# Patient Record
Sex: Male | Born: 1975 | Hispanic: Yes | Marital: Married | Smoking: Never smoker
Health system: Southern US, Community
[De-identification: ages and names within clinical notes are randomized; demographics above are authoritative.]

## PROBLEM LIST (undated history)

## (undated) DIAGNOSIS — E785 Hyperlipidemia, unspecified: Secondary | ICD-10-CM

## (undated) HISTORY — DX: Hyperlipidemia, unspecified: E78.5

---

## 2007-01-17 ENCOUNTER — Emergency Department: Payer: Self-pay | Admitting: Emergency Medicine

## 2010-07-28 HISTORY — PX: OTHER SURGICAL HISTORY: SHX169

## 2010-12-05 ENCOUNTER — Emergency Department: Payer: Self-pay | Admitting: Emergency Medicine

## 2010-12-06 HISTORY — PX: OTHER SURGICAL HISTORY: SHX169

## 2010-12-10 HISTORY — PX: OTHER SURGICAL HISTORY: SHX169

## 2010-12-27 ENCOUNTER — Ambulatory Visit: Payer: Self-pay | Admitting: Family Medicine

## 2011-01-30 ENCOUNTER — Encounter: Payer: Self-pay | Admitting: Internal Medicine

## 2011-01-31 ENCOUNTER — Ambulatory Visit (INDEPENDENT_AMBULATORY_CARE_PROVIDER_SITE_OTHER): Payer: BC Managed Care – PPO | Admitting: Internal Medicine

## 2011-01-31 ENCOUNTER — Encounter: Payer: Self-pay | Admitting: Internal Medicine

## 2011-01-31 ENCOUNTER — Other Ambulatory Visit (INDEPENDENT_AMBULATORY_CARE_PROVIDER_SITE_OTHER): Payer: BC Managed Care – PPO

## 2011-01-31 VITALS — HR 56 | Temp 98.3°F | Ht 66.0 in | Wt 174.0 lb

## 2011-01-31 DIAGNOSIS — R079 Chest pain, unspecified: Secondary | ICD-10-CM

## 2011-01-31 DIAGNOSIS — R918 Other nonspecific abnormal finding of lung field: Secondary | ICD-10-CM

## 2011-01-31 LAB — CBC WITH DIFFERENTIAL/PLATELET
Eosinophils Absolute: 0.6 10*3/uL (ref 0.0–0.7)
Lymphocytes Relative: 43.5 % (ref 12.0–46.0)
MCHC: 35 g/dL (ref 30.0–36.0)
MCV: 88 fl (ref 78.0–100.0)
Monocytes Absolute: 0.4 10*3/uL (ref 0.1–1.0)
Neutrophils Relative %: 42.4 % — ABNORMAL LOW (ref 43.0–77.0)
Platelets: 223 10*3/uL (ref 150.0–400.0)
WBC: 7.3 10*3/uL (ref 4.5–10.5)

## 2011-01-31 LAB — HEPATIC FUNCTION PANEL
AST: 24 U/L (ref 0–37)
Total Bilirubin: 0.6 mg/dL (ref 0.3–1.2)

## 2011-01-31 LAB — BASIC METABOLIC PANEL
BUN: 15 mg/dL (ref 6–23)
Creatinine, Ser: 0.9 mg/dL (ref 0.4–1.5)
GFR: 107.65 mL/min (ref >60.00)
Potassium: 3.6 mEq/L (ref 3.5–5.1)

## 2011-01-31 NOTE — Progress Notes (Signed)
Subjective:     Patient ID: Evan Anderson, male   DOB: 1976/01/26, 35 y.o.   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  01/31/2011 Initial pulmonary office cc  shooting pains and tightness x 2 years much worse x 2 month always located in the exact same area = just  anteriorly around R breast on the right definitely worse with deep breath,  Absent lying down.  Started flovent helped a little bit,  Was on fish oil   at the time the pain got bad.  Had bad tooth but this was removed in Nov 2011. No other exposures noted. Baseline cxr is normal, the only finding was abn RN changes sym bilaterally by CT December 27 2010 in [place] so referred to pulmonary clinic for this eval.  Pt denies any significant sore throat, dysphagia, itching, sneezing,  nasal congestion or excess/ purulent secretions,  fever, chills, sweats, unintended wt loss, classically lateralizing  pleuritic or exertional cp, hempoptysis, orthopnea pnd or leg swelling.    Also denies any obvious fluctuation of symptoms with weather or environmental changes or other aggravating or alleviating factors.        Review of Systems  Constitutional: Negative for fever, chills, activity change, appetite change and unexpected weight change.  HENT: Negative for congestion, sore throat, rhinorrhea, sneezing, trouble swallowing, dental problem, voice change and postnasal drip.   Eyes: Negative for visual disturbance.  Respiratory: Negative for cough, choking and shortness of breath.   Cardiovascular: Positive for chest pain. Negative for leg swelling.  Gastrointestinal: Negative for nausea, vomiting and abdominal pain.  Genitourinary: Negative for difficulty urinating.  Musculoskeletal: Negative for arthralgias.  Skin:  Negative for rash.  Psychiatric/Behavioral: Negative for behavioral problems and confusion. The patient is nervous/anxious.        Objective:   Physical Exam Pleasant healthy appearing latino male NAD Wt 174 01/31/11 HEENT: nl dentition, turbinates, and orophanx. Nl external ear canals without cough reflex   NECK :  without JVD/Nodes/TM/ nl carotid upstrokes bilaterally   LUNGS: no acc muscle use, clear to A and P bilaterally without cough on insp or exp maneuvers   CV:  RRR  no s3 or murmur or increase in P2, no edema   ABD:  soft and nontender with nl excursion in the supine position. No bruits or organomegaly, bowel sounds nl  MS:  warm without deformities, calf tenderness, cyanosis or clubbing  SKIN: warm and dry without lesions    NEURO:  alert, approp, no deficits      Assessment:         Plan:

## 2011-01-31 NOTE — Patient Instructions (Addendum)
Return as soon as possible for PFT's   Citrucel 1 tsp twice daily with large glass of water and avoid all foods that cause gas (beans, uncooked vegetables and salads, boiled eggs)  Stop flovent and fish oil for now

## 2011-02-01 DIAGNOSIS — R918 Other nonspecific abnormal finding of lung field: Secondary | ICD-10-CM | POA: Insufficient documentation

## 2011-02-01 DIAGNOSIS — R079 Chest pain, unspecified: Secondary | ICD-10-CM | POA: Insufficient documentation

## 2011-02-01 NOTE — Assessment & Plan Note (Signed)
DDx for pulmonary fibrosis  includes idiopathic pulmonary fibrosis, pulmonary fibrosis associated with rheumatologic diseases (which have a relatively benign course in most cases) , adverse effect from  drugs such as chemotherapy or amiodarone exposure, nonspecific interstitial pneumonia which is typically steroid responsive, and chronic hypersensitivity pneumonitis.   In active  smokers Langerhan's Cell  Histiocyctosis (eosinophilic granuomatosis),  DIP,  and Respiratory Bronchiolitis ILD also need to be considered,    Not clear this anything more than previous lung injury or exposure as baseline cxr is nl as of May 2012.  Will repeat cxr and PFT's to see if any macroscopic changes evident but very strongly doubt this has anything to do with his symptoms and not severe or active enough to warrant invasive w/u with  ESR.= 6    See instructions for specific recommendations which were reviewed directly with the patient who was given a copy with highlighter outlining the key components.

## 2011-02-01 NOTE — Assessment & Plan Note (Signed)
Classic but atypical pain pattern suggests ibs:  Stereotypical,  with a very limited distribution of pain locations, daytime, not exacerbated by ex or coughing, worse in sitting position,   not present supine due to the dome effect of the diaphragm is  canceled in that position. Frequently these patients have had multiple negative GI workups and CT scans.  Treatment consists of avoiding foods that cause gas (especially beans and raw vegetables like spinach and salads)  and citrucel 1 heaping tsp twice daily with a large glass of water.  Pain should improve w/in 2 weeks and if not then consider further GI work up.

## 2011-02-04 ENCOUNTER — Telehealth: Payer: Self-pay | Admitting: Internal Medicine

## 2011-02-04 NOTE — Progress Notes (Signed)
Quick Note:  Spoke with pt and notified of results per Dr. Wert. Pt verbalized understanding and denied any questions.  ______ 

## 2011-02-04 NOTE — Telephone Encounter (Signed)
Spoke with pt and notified of results per Dr. Wert. Pt verbalized understanding and denied any questions. 

## 2011-02-12 ENCOUNTER — Ambulatory Visit (INDEPENDENT_AMBULATORY_CARE_PROVIDER_SITE_OTHER): Payer: BC Managed Care – PPO | Admitting: Internal Medicine

## 2011-02-12 ENCOUNTER — Telehealth: Payer: Self-pay | Admitting: *Deleted

## 2011-02-12 ENCOUNTER — Encounter: Payer: Self-pay | Admitting: Internal Medicine

## 2011-02-12 VITALS — BP 112/82 | HR 56 | Temp 97.9°F | Ht 66.0 in | Wt 172.0 lb

## 2011-02-12 DIAGNOSIS — R918 Other nonspecific abnormal finding of lung field: Secondary | ICD-10-CM

## 2011-02-12 DIAGNOSIS — R079 Chest pain, unspecified: Secondary | ICD-10-CM

## 2011-02-12 LAB — PULMONARY FUNCTION TEST
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
## 2011-02-12 NOTE — Patient Instructions (Addendum)
Continue advil 3 with meals as needed   Continue to avoid foods you know cause gas  Please schedule a follow up visit in 2 months with CXR on return unless condition worsens >   call sooner
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
## 2011-02-12 NOTE — Assessment & Plan Note (Addendum)
Followed in Pulmonary clinic/ [facility]/ Maricia Scotti    - See CT Chest [place] 12/27/10, sym diffuse    -  Minimal Eosinophilia, nl ESR    01/31/11    -  PFTs wnl 02/12/2011     -  F/u cxr in tickle file for 03/2011   Muliple pulmonary nodules < 8 mm are too small to be detected by PET scanning,  minimally invasive bx or f/u on cxr for that matter, and old xrays won't do any good here.   Most likely they are benign; however, given the mulitple sites noted, there is no early option for surgical cure at this point unless one grows relative to the others (suggesting two separate processes)   Discussed in detail all the  indications, usual  risks and alternatives  relative to the benefits with patient who agrees to proceed with f/u cxr in 2 months (placed in tickle file)

## 2011-02-12 NOTE — Progress Notes (Signed)
PFT done today. 

## 2011-02-12 NOTE — Telephone Encounter (Signed)
error 

## 2011-03-07 ENCOUNTER — Telehealth: Payer: Self-pay | Admitting: *Deleted

## 2011-03-07 NOTE — Telephone Encounter (Signed)
Per MW- pt due for f/u cxr by 04/15/11. LMTCB so we can set this up now.

## 2011-03-07 NOTE — Telephone Encounter (Signed)
Message copied by Christen Butter on Fri Mar 07, 2011  5:00 PM ------      Message from: Christen Butter      Created: Wed Feb 12, 2011  4:40 PM       Pt needs cxr by Sept 18th 2012

## 2011-03-11 NOTE — Telephone Encounter (Signed)
Spoke with pt's wife.  She states this has already been set up at last OV.  Per last OV on 02/12/11, pt was to f/u in 2 months with a cxr.  F/u was scheduled for 04/15/11 at 8:45am -- wife aware and will inform pt and will have him arrive early for cxr first.  Will forward message to MW so he is aware -- if this date is ok with you, pls sign off on note.  Thanks!

## 2011-03-11 NOTE — Telephone Encounter (Signed)
PT'S SPOUSE MICHELE RETURNED CALL. [phone]

## 2011-04-14 ENCOUNTER — Other Ambulatory Visit: Payer: Self-pay | Admitting: Internal Medicine

## 2011-04-14 DIAGNOSIS — R918 Other nonspecific abnormal finding of lung field: Secondary | ICD-10-CM

## 2011-04-15 ENCOUNTER — Encounter: Payer: BC Managed Care – PPO | Admitting: Internal Medicine

## 2011-04-15 NOTE — Progress Notes (Signed)
Subjective:     Patient ID: Evan Anderson, male   DOB: Aug 17, 1975, 35 y.o.   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  01/31/2011 Initial pulmonary office cc  shooting pains and tightness x 2 years much worse x 2 month always located in the exact same area = just  anteriorly around R breast on the right definitely worse with deep breath,  Absent lying down.  Started flovent helped a little bit,  Was on fish oil   at the time the pain got bad.  Had bad tooth but this was removed in Nov 2011. No other exposures noted but does remember serious injury to ant chest wall with similar pain about a year before onset of pain but this was more anterior than R side, did hurt worse with insp and exercise as is the case with this pain  Baseline cxr is normal, the only finding was abn RN changes sym bilaterally by CT December 27 2010 in [place] so referred to pulmonary clinic for this eval.  rec Citrucel 1 tsp twice daily with large glass of water and avoid all foods that cause gas (beans, uncooked vegetables and salads, boiled eggs)  Stop flovent and fish oil for now   02/12/2011 ov/Dehaven Sine cc breathing better but pain is predictable, worse when work out, better when lie down, better with advil but only ever takes 2 at most. 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    Also denies any obvious fluctuation of symptoms with weather or environmental changes or other  aggravating or alleviating factors.                    Objective:   Physical Exam Pleasant healthy appearing latino male NAD Wt 174 01/31/11 > wt 172  02/12/2011  > 04/15/2011  HEENT: nl dentition, turbinates, and orophanx. Nl external ear canals without cough reflex   NECK :  without JVD/Nodes/TM/ nl carotid upstrokes bilaterally   LUNGS: no acc muscle use, clear to A and P bilaterally without cough on insp or exp maneuvers   CV:  RRR  no s3 or murmur or increase in P2, no edema   ABD:  soft and nontender with nl excursion in the supine position. No bruits or organomegaly, bowel sounds nl  MS:  warm without deformities, calf tenderness, cyanosis or clubbing  SKIN: warm and dry without lesions    c    Assessment:         Plan:

## 2012-07-14 IMAGING — CT CT HEAD WITHOUT CONTRAST
2 series · 16 of 30 positions shown, 20 images · non-contrast
Comparison: none

REASON FOR EXAM: headache
COMMENTS:

[Series 2: without · axial · non-contrast · 0.43mm/px · z∈[+1090,+1214]mm · 13 of 31 slices shown, 17 images]
[im 3/31  brain]
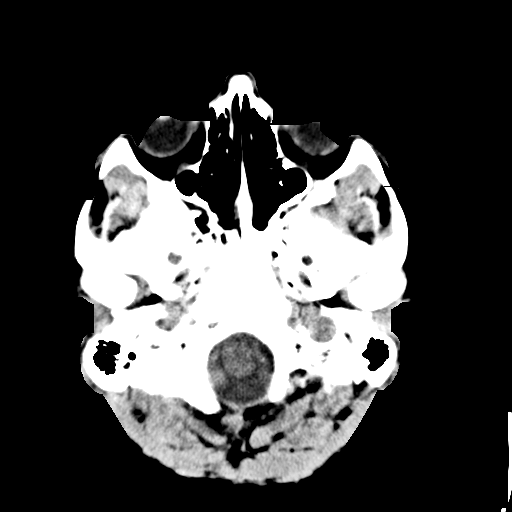
[im 3/31  bone]
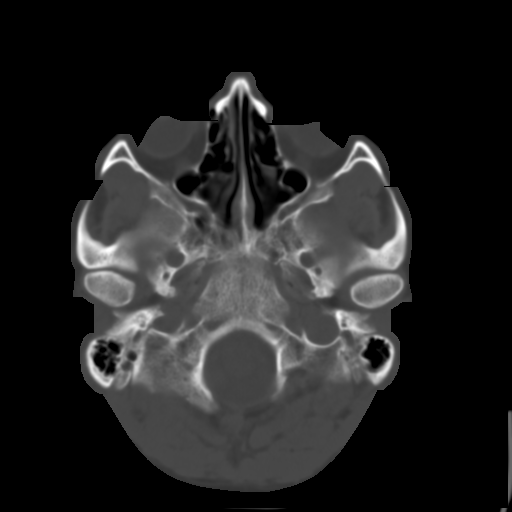
[im 5/31  brain]
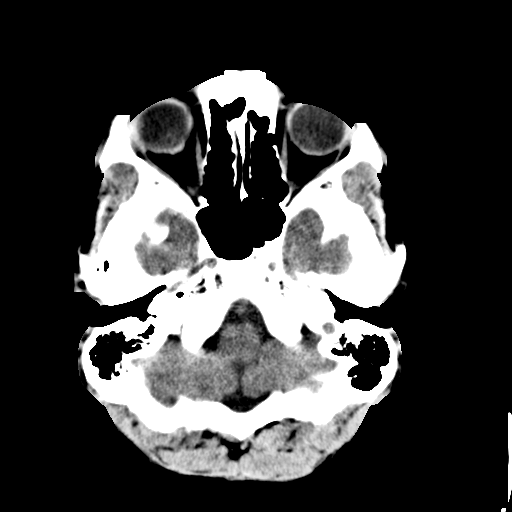
[im 7/31  brain]
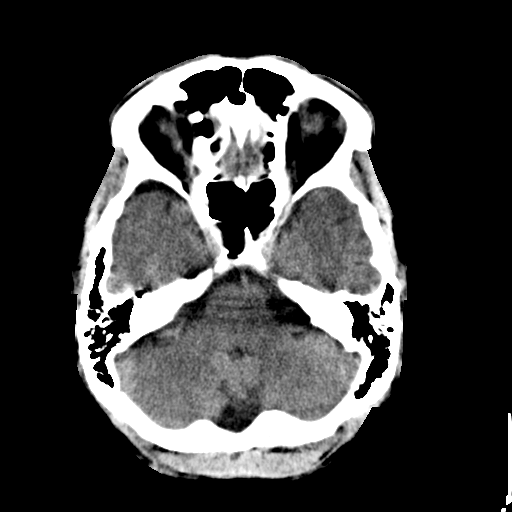
[im 9/31  brain]
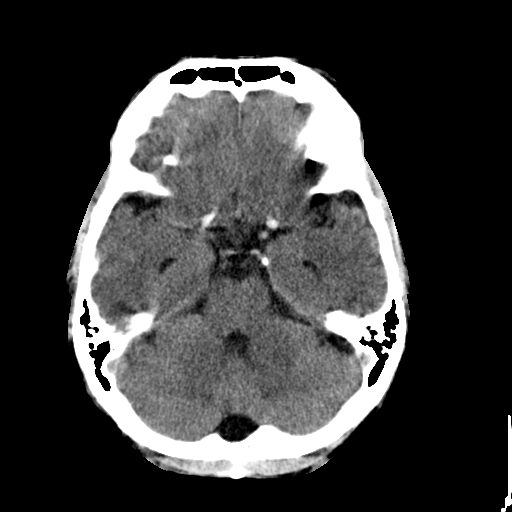
[im 11/31  brain]
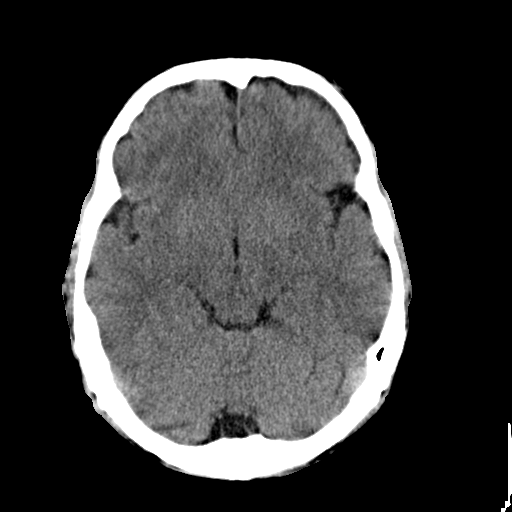
[im 11/31  bone]
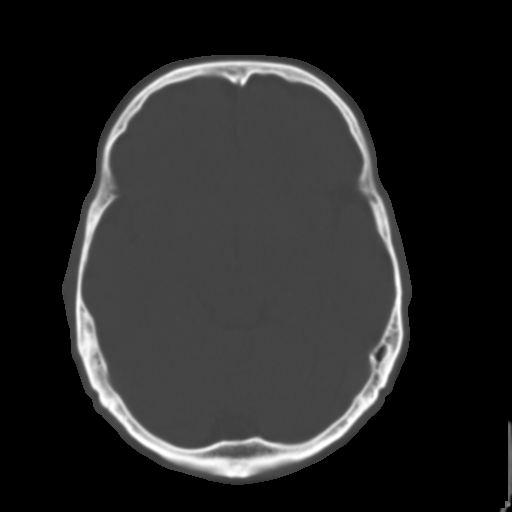
[im 13/31  brain]
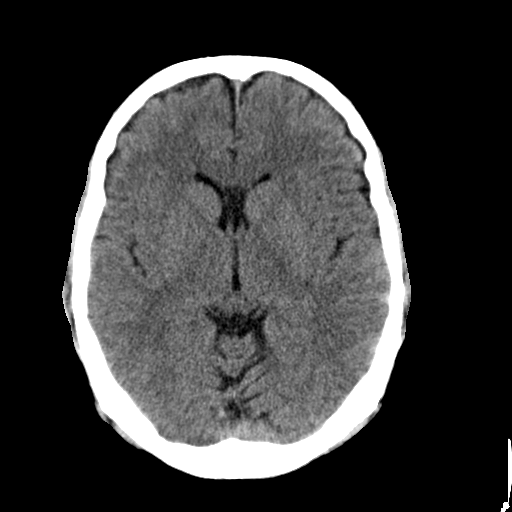
[im 16/31  brain]
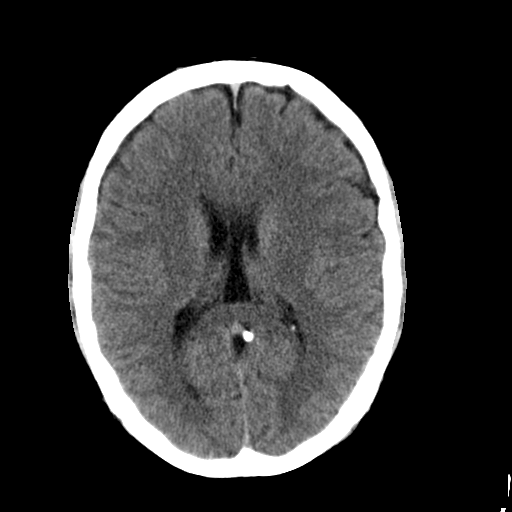
[im 18/31  brain]
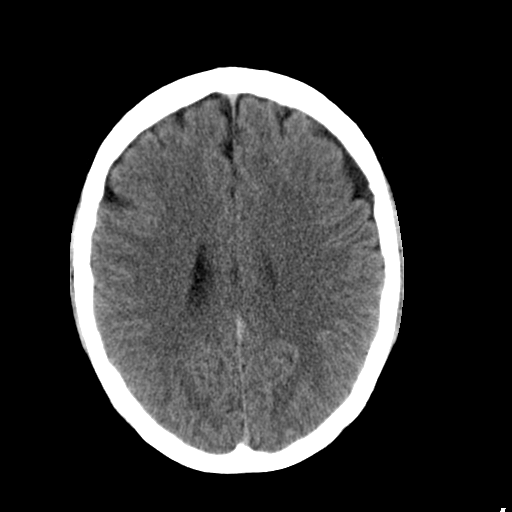
[im 20/31  brain]
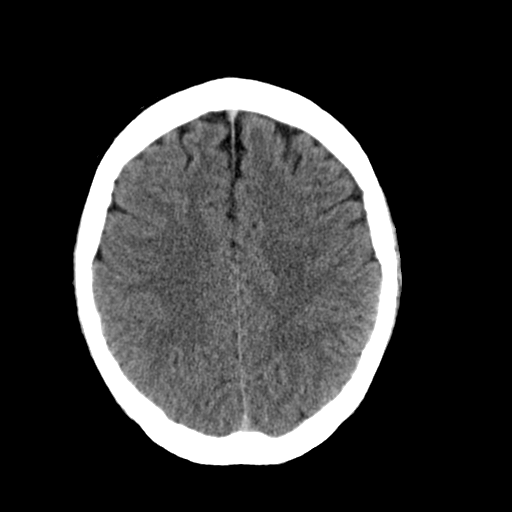
[im 20/31  bone]
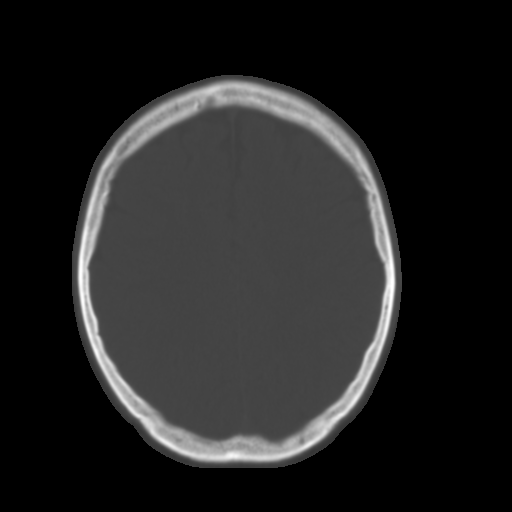
[im 22/31  brain]
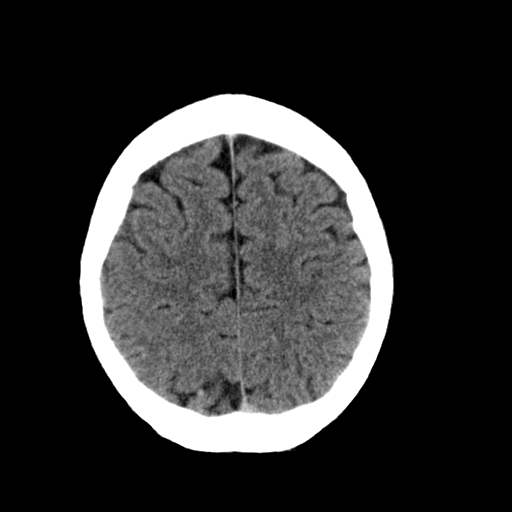
[im 24/31  brain]
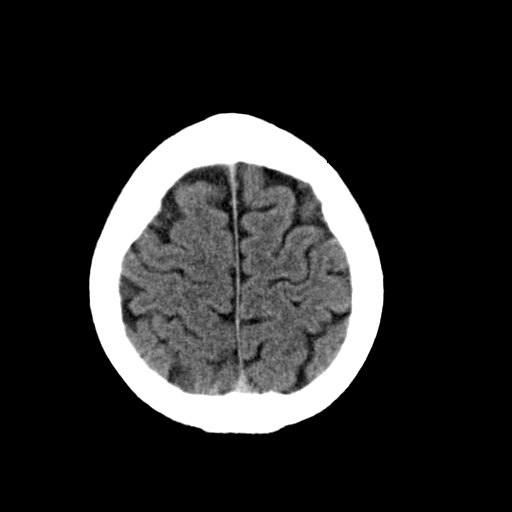
[im 26/31  brain]
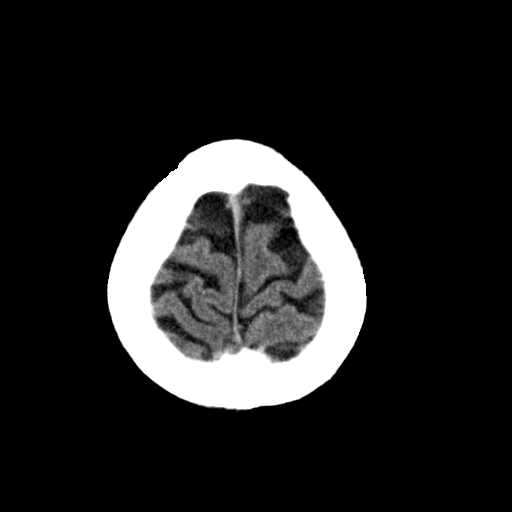
[im 28/31  brain]
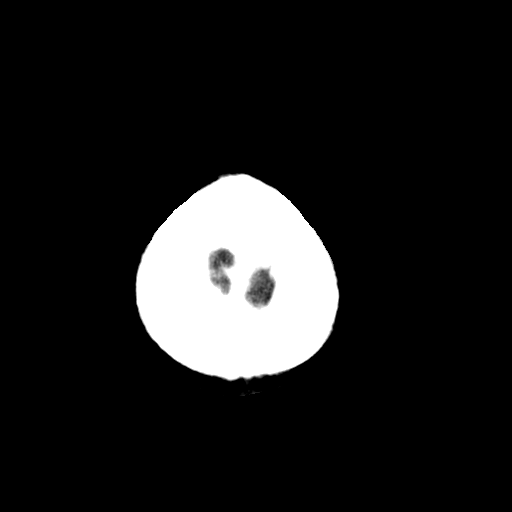
[im 28/31  bone]
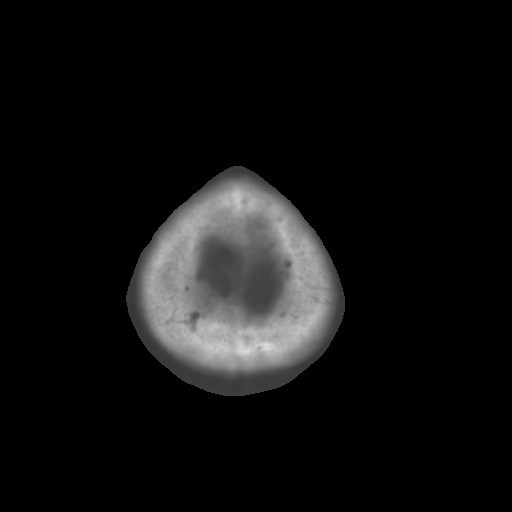

[Series 3: bone · axial · 0.43mm/px · z∈[+1090,+1130]mm · 3 of 31 slices shown]
[im 3/31  bone]
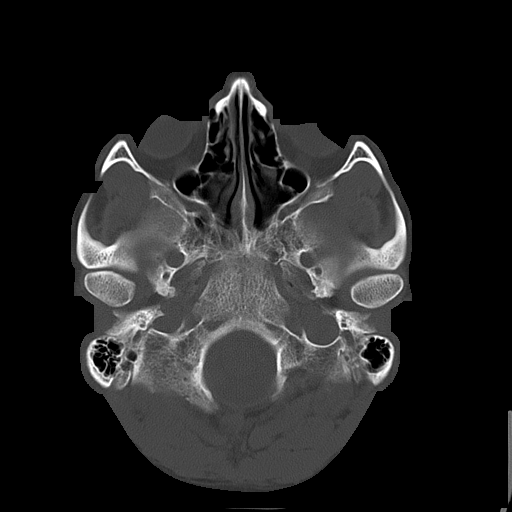
[im 7/31  bone]
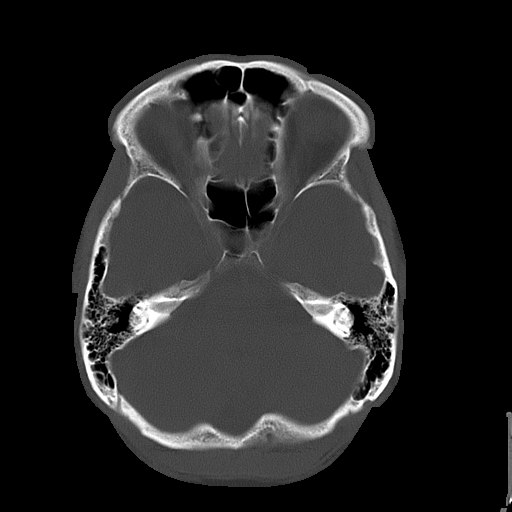
[im 11/31  bone]
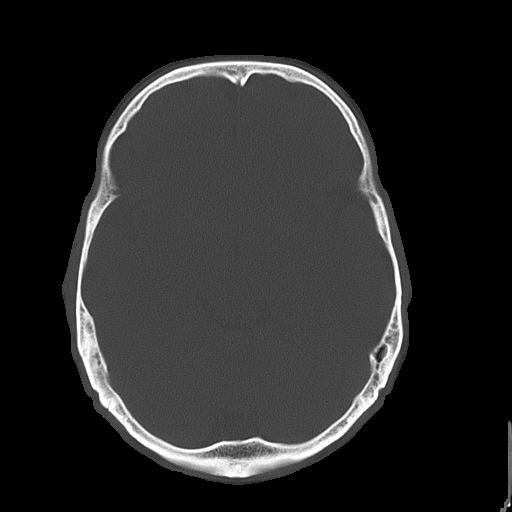

[16 of 30 positions shown; findings below may reference images not displayed]

PROCEDURE:     CT  - CT HEAD WITHOUT CONTRAST  - December 06, 2010  [DATE]

RESULT:     Axial noncontrast CT scanning was performed through the brain at
5 mm intervals and slice thicknesses.

The ventricles are normal in size and position. There is a cavum septum
pellucidum et vergae which is a normal variant. There is no intracranial
hemorrhage nor intracranial mass effect. There is no evidence of an evolving
ischemic infarction. The cerebellum and brainstem exhibit no acute
abnormality. At bone window settings the observed portions of the paranasal
sinuses and mastoid air cells are clear.
IMPRESSION: I see no acute intracranial abnormality.

A preliminary report was sent to the [HOSPITAL] the conclusion
of the study.

## 2012-10-19 ENCOUNTER — Ambulatory Visit: Payer: Self-pay | Admitting: Family Medicine

## 2013-03-04 ENCOUNTER — Ambulatory Visit: Payer: Self-pay | Admitting: Family Medicine

## 2013-04-13 NOTE — Progress Notes (Signed)
This encounter was created in error - please disregard.

## 2014-10-31 LAB — LIPID PANEL
Cholesterol: 224 mg/dL — AB (ref 0–200)
HDL: 42 mg/dL (ref 35–70)
LDL Cholesterol: 140 mg/dL
Triglycerides: 210 mg/dL — AB (ref 40–160)

## 2014-10-31 LAB — HEMOGLOBIN A1C: HEMOGLOBIN A1C: 5.9

## 2015-02-21 ENCOUNTER — Telehealth: Payer: Self-pay | Admitting: Family Medicine

## 2015-02-21 DIAGNOSIS — E785 Hyperlipidemia, unspecified: Secondary | ICD-10-CM

## 2015-02-21 DIAGNOSIS — R7303 Prediabetes: Secondary | ICD-10-CM | POA: Insufficient documentation

## 2015-02-21 NOTE — Telephone Encounter (Signed)
Pt's wife request a lab slip for A1C & cholesterol. Thanks TNP

## 2015-02-21 NOTE — Telephone Encounter (Signed)
Order printed needs to be fasting

## 2015-02-22 DIAGNOSIS — Z0000 Encounter for general adult medical examination without abnormal findings: Secondary | ICD-10-CM

## 2015-02-23 ENCOUNTER — Telehealth: Payer: Self-pay

## 2015-02-23 LAB — HEMOGLOBIN A1C
Est. average glucose Bld gHb Est-mCnc: 123 mg/dL
HEMOGLOBIN A1C: 5.9 % — AB (ref 4.8–5.6)

## 2015-02-23 LAB — LIPID PANEL
CHOL/HDL RATIO: 5.1 ratio — AB (ref 0.0–5.0)
CHOLESTEROL TOTAL: 240 mg/dL — AB (ref 100–199)
HDL: 47 mg/dL (ref >39)
LDL CALC: 164 mg/dL — AB (ref 0–99)
TRIGLYCERIDES: 147 mg/dL (ref 0–149)
VLDL Cholesterol Cal: 29 mg/dL (ref 5–40)

## 2015-02-23 NOTE — Telephone Encounter (Signed)
Patient wife Marcelino Duster called wanting lab results. Patient had blood work done yesterday. I see that the results are in but have not been reviewed and signed off on yet. Call patient wife at [phone] with results.

## 2015-02-26 ENCOUNTER — Telehealth: Payer: Self-pay | Admitting: Family Medicine

## 2015-02-26 ENCOUNTER — Other Ambulatory Visit: Payer: Self-pay | Admitting: *Deleted

## 2015-02-26 NOTE — Telephone Encounter (Signed)
Patient notified of results. Patient has been taking pravastatin 20 mg daily. Patient stated that he just refilled his rx and has 30 pills left.

## 2015-02-26 NOTE — Telephone Encounter (Signed)
-----   Message from Malva Limes, MD sent at 02/26/2015  7:57 AM EDT ----- Cholesterol is up quite a bit from 224 to 240. HgA1c is stable at 5.9, which just slightly higher then normal. If he is taking his cholesterol medications he will need to change to something stronger, if not then he needs to start back on it.

## 2015-02-26 NOTE — Telephone Encounter (Signed)
Patient notified of results. Expressed understanding.

## 2015-02-26 NOTE — Telephone Encounter (Signed)
Results from a1c and chol.  [phone].  Please call for results.  Thanks, Barth Kirks

## 2015-02-27 NOTE — Telephone Encounter (Signed)
Advised patient as below.  

## 2015-02-27 NOTE — Telephone Encounter (Signed)
Need to increase pravastatin to  a day, #30, rf x 6. May take 2 x  a day until current bottle is out. Need to recheck lipids in 6 weeks.

## 2015-03-30 ENCOUNTER — Telehealth: Payer: Self-pay | Admitting: Family Medicine

## 2015-03-30 DIAGNOSIS — E785 Hyperlipidemia, unspecified: Secondary | ICD-10-CM

## 2015-03-30 NOTE — Telephone Encounter (Signed)
Pt wife, Marcelino Duster request a lab slip to recheck cholesterol and A1C.  CB#[phone]/MW

## 2015-04-04 NOTE — Telephone Encounter (Signed)
Did patient only need the lipid panel rechecked and not the A1c? See phone note from 02/26/2015 in chart review.

## 2015-04-04 NOTE — Telephone Encounter (Signed)
Pt wife is calling to ask when this will be ready.  CB#[phone]/MW

## 2015-04-04 NOTE — Telephone Encounter (Signed)
Lab ready to pick-up.

## 2015-04-04 NOTE — Telephone Encounter (Signed)
Just needs lipids. A1c should be checked no more than twice a year.

## 2015-04-05 ENCOUNTER — Other Ambulatory Visit: Payer: Self-pay | Admitting: Family Medicine

## 2015-04-05 DIAGNOSIS — Z0000 Encounter for general adult medical examination without abnormal findings: Secondary | ICD-10-CM

## 2015-04-06 LAB — LIPID PANEL
CHOL/HDL RATIO: 4.6 ratio (ref 0.0–5.0)
CHOLESTEROL TOTAL: 221 mg/dL — AB (ref 100–199)
HDL: 48 mg/dL (ref >39)
LDL CALC: 139 mg/dL — AB (ref 0–99)
TRIGLYCERIDES: 171 mg/dL — AB (ref 0–149)
VLDL CHOLESTEROL CAL: 34 mg/dL (ref 5–40)

## 2015-06-05 ENCOUNTER — Other Ambulatory Visit: Payer: Self-pay | Admitting: Family Medicine

## 2015-06-05 NOTE — Telephone Encounter (Signed)
Please call in alprazolam.  

## 2015-06-05 NOTE — Telephone Encounter (Signed)
Rx called in to pharmacy. 

## 2015-06-19 ENCOUNTER — Other Ambulatory Visit: Payer: Self-pay | Admitting: Family Medicine

## 2015-06-19 ENCOUNTER — Telehealth: Payer: Self-pay | Admitting: Family Medicine

## 2015-06-19 NOTE — Telephone Encounter (Signed)
Pt's wife calling stating pt needs refill on his pravastatin (PRAVACHOL) 20 MG tablet   wife states this needs to be changed to a 30 day supply its only a 15 day supply, pt's wife states they are going out of town would like for this to be done ASAP if possible. I explain to the wife Dr's have a turn around of 24 to 48 hours on refills on prescriptions. @ [facility] [address] [place].  Thanks CC

## 2015-06-19 NOTE — Telephone Encounter (Signed)
Please review-aa 

## 2015-06-20 ENCOUNTER — Other Ambulatory Visit: Payer: Self-pay | Admitting: Family Medicine

## 2015-06-20 MED ORDER — PRAVASTATIN SODIUM 40 MG PO TABS: 40.0000 mg | ORAL_TABLET | Freq: Every day | ORAL | Status: DC

## 2015-09-10 ENCOUNTER — Ambulatory Visit (INDEPENDENT_AMBULATORY_CARE_PROVIDER_SITE_OTHER): Payer: Self-pay | Admitting: Family Medicine

## 2015-09-10 ENCOUNTER — Encounter: Payer: Self-pay | Admitting: Family Medicine

## 2015-09-10 VITALS — BP 104/74 | HR 52 | Temp 98.1°F | Resp 16 | Wt 176.6 lb

## 2015-09-10 DIAGNOSIS — M2550 Pain in unspecified joint: Secondary | ICD-10-CM | POA: Insufficient documentation

## 2015-09-10 DIAGNOSIS — G4700 Insomnia, unspecified: Secondary | ICD-10-CM | POA: Insufficient documentation

## 2015-09-10 DIAGNOSIS — R7303 Prediabetes: Secondary | ICD-10-CM

## 2015-09-10 DIAGNOSIS — F410 Panic disorder [episodic paroxysmal anxiety] without agoraphobia: Secondary | ICD-10-CM | POA: Insufficient documentation

## 2015-09-10 DIAGNOSIS — Z8709 Personal history of other diseases of the respiratory system: Secondary | ICD-10-CM | POA: Insufficient documentation

## 2015-09-10 DIAGNOSIS — F419 Anxiety disorder, unspecified: Secondary | ICD-10-CM | POA: Insufficient documentation

## 2015-09-10 DIAGNOSIS — R9389 Abnormal findings on diagnostic imaging of other specified body structures: Secondary | ICD-10-CM | POA: Insufficient documentation

## 2015-09-10 DIAGNOSIS — M791 Myalgia, unspecified site: Secondary | ICD-10-CM | POA: Insufficient documentation

## 2015-09-10 DIAGNOSIS — E785 Hyperlipidemia, unspecified: Secondary | ICD-10-CM

## 2015-09-10 NOTE — Progress Notes (Signed)
Patient: Evan Anderson Male    DOB: 07-15-1976   40 y.o.   MRN: [id number] Visit Date: 09/10/2015  Today's Provider: Mila Merry, MD   Chief Complaint  Patient presents with  . Follow-up  . Hyperlipidemia  . Insomnia   Subjective:    HPI    Follow-up for insomnia from 10/30/2014; no changes. Continue Xanax 0.25 mg prn.   Follow-up for myalgia from 10/30/2014; no changes. Continue methocarbamol 750 mg.   Follow-up for arthralgia from 10/30/2014; started Nabumetone 500 mg prn for joint pain.    Lipid/Cholesterol, Follow-up:   Last seen for this10 months ago.  Management changes since that visit include; no changes. . Last Lipid Panel:    Component Value Date/Time   CHOL 221* 04/05/2015 0819   CHOL 224* 10/31/2014   TRIG 171* 04/05/2015 0819   HDL 48 04/05/2015 0819   HDL 42 10/31/2014   CHOLHDL 4.6 04/05/2015 0819   LDLCALC 139* 04/05/2015 0819   LDLCALC 140 10/31/2014    Risk factors for vascular disease include none  He reports good compliance with treatment. He is not having side effects.  Current symptoms include none and have been unchanged. Weight trend: stable Prior visit with dietician: no Current diet: in general, a "healthy" diet   Current exercise: running/ jogging/weight lifting  Wt Readings from Last 3 Encounters:  09/10/15 176 lb 9.6 oz (80.105 kg)  02/12/11 172 lb (78.019 kg)  01/31/11 174 lb (78.926 kg)    -------------------------------------------------------------------    No Known Allergies Previous Medications   ALPRAZOLAM (XANAX) 0.25 MG TABLET    TAKE 1 TO 2 TABLETS BY MOUTH EVERY DAY AS NEEDED   IBUPROFEN (ADVIL,MOTRIN) 200 MG TABLET    3 tablets 2 times daily    METHOCARBAMOL (ROBAXIN) 750 MG TABLET    Take 2 tablets by mouth every 6 (six) hours as needed.   PRAVASTATIN (PRAVACHOL) 40 MG TABLET    Take 1 tablet (40 mg total) by mouth daily.    Review of Systems  Constitutional: Negative for fever, chills and appetite  change.  Respiratory: Negative for chest tightness, shortness of breath and wheezing.   Cardiovascular: Negative for chest pain and palpitations.  Gastrointestinal: Negative for nausea, vomiting and abdominal pain.    Social History  Substance Use Topics  . Smoking status: Never Smoker   . Smokeless tobacco: Never Used  . Alcohol Use: 0.0 oz/week    0 Standard drinks or equivalent per week     Comment: occasional use   Objective:   BP 104/74 mmHg  Pulse 52  Temp(Src) 98.1 F (36.7 C) (Oral)  Resp 16  Wt 176 lb 9.6 oz (80.105 kg)  SpO2 99%  Physical Exam   General Appearance:    Alert, cooperative, no distress  Eyes:    PERRL, conjunctiva/corneas clear, EOM's intact       Lungs:     Clear to auscultation bilaterally, respirations unlabored  Heart:    Regular rate and rhythm  Neurologic:   Awake, alert, oriented x 3. No apparent focal neurological           defect.           Assessment & Plan:     1. Hyperlipidemia He is tolerating pravastatin well with no adverse effects.   - Hepatic function panel - Lipid panel  2. Prediabetes  - Hemoglobin A1c        Mila Merry, MD  [facility]

## 2015-09-11 LAB — HEPATIC FUNCTION PANEL
ALK PHOS: 100 IU/L (ref 39–117)
ALT: 37 IU/L (ref 0–44)
AST: 25 IU/L (ref 0–40)
Albumin: 4.4 g/dL (ref 3.5–5.5)
BILIRUBIN TOTAL: 0.8 mg/dL (ref 0.0–1.2)
BILIRUBIN, DIRECT: 0.17 mg/dL (ref 0.00–0.40)
Total Protein: 7.1 g/dL (ref 6.0–8.5)

## 2015-09-11 LAB — LIPID PANEL
CHOLESTEROL TOTAL: 160 mg/dL (ref 100–199)
Chol/HDL Ratio: 3.7 ratio units (ref 0.0–5.0)
HDL: 43 mg/dL (ref >39)
LDL Calculated: 90 mg/dL (ref 0–99)
Triglycerides: 135 mg/dL (ref 0–149)
VLDL CHOLESTEROL CAL: 27 mg/dL (ref 5–40)

## 2015-09-11 LAB — HEMOGLOBIN A1C
ESTIMATED AVERAGE GLUCOSE: 117 mg/dL
HEMOGLOBIN A1C: 5.7 % — AB (ref 4.8–5.6)

## 2015-09-12 ENCOUNTER — Ambulatory Visit: Payer: Self-pay | Admitting: Family Medicine

## 2015-10-14 ENCOUNTER — Other Ambulatory Visit: Payer: Self-pay | Admitting: Family Medicine

## 2015-12-29 ENCOUNTER — Other Ambulatory Visit: Payer: Self-pay | Admitting: Family Medicine

## 2015-12-30 NOTE — Telephone Encounter (Signed)
Please call in alprazolam.  

## 2015-12-31 MED ORDER — ALPRAZOLAM 0.25 MG PO TABS: ORAL_TABLET | ORAL | Status: DC

## 2015-12-31 NOTE — Telephone Encounter (Signed)
[facility], something is wrong with their phones. Will try again later.

## 2015-12-31 NOTE — Telephone Encounter (Signed)
Rx was faxed to pharmacy.  

## 2016-07-18 ENCOUNTER — Other Ambulatory Visit: Payer: Self-pay | Admitting: Family Medicine

## 2016-07-24 ENCOUNTER — Other Ambulatory Visit: Payer: Self-pay | Admitting: Family Medicine

## 2016-07-24 NOTE — Telephone Encounter (Signed)
Please call in alprazolam.  

## 2016-07-25 NOTE — Telephone Encounter (Signed)
Rx called in to pharmacy. 

## 2016-10-18 ENCOUNTER — Other Ambulatory Visit: Payer: Self-pay | Admitting: Family Medicine

## 2016-12-01 ENCOUNTER — Ambulatory Visit (INDEPENDENT_AMBULATORY_CARE_PROVIDER_SITE_OTHER): Payer: Self-pay | Admitting: Family Medicine

## 2016-12-01 ENCOUNTER — Encounter: Payer: Self-pay | Admitting: Family Medicine

## 2016-12-01 VITALS — BP 122/78 | HR 65 | Temp 97.8°F | Resp 16 | Wt 184.0 lb

## 2016-12-01 DIAGNOSIS — H6982 Other specified disorders of Eustachian tube, left ear: Secondary | ICD-10-CM

## 2016-12-01 DIAGNOSIS — R7303 Prediabetes: Secondary | ICD-10-CM

## 2016-12-01 DIAGNOSIS — H6692 Otitis media, unspecified, left ear: Secondary | ICD-10-CM

## 2016-12-01 DIAGNOSIS — E785 Hyperlipidemia, unspecified: Secondary | ICD-10-CM

## 2016-12-01 MED ORDER — AMOXICILLIN 500 MG PO CAPS: 1000.0000 mg | ORAL_CAPSULE | Freq: Two times a day (BID) | ORAL | 0 refills | Status: AC

## 2016-12-01 MED ORDER — FLUTICASONE PROPIONATE 50 MCG/ACT NA SUSP: 2.0000 | Freq: Every day | NASAL | 1 refills | Status: DC

## 2016-12-01 NOTE — Progress Notes (Signed)
Patient: Evan Anderson Male    DOB: Apr 17, 1976   40 y.o.   MRN: [id number] Visit Date: 12/01/2016  Today's Provider: Mila Merryonald Fisher, MD   Chief Complaint  Patient presents with  . Ear Pain    x 1 week   Subjective:    HPI Ear pain:   Patient comes in today complaining of pain in his left ear for the past week. Pain is described as a sharp pain with pressure. Patient denies any fever. He has had some nasal drainage and headache. Patient has tried taking Tylenol, which has helped to relieve some pain.   He is also here for follow up hyperlipidemia.  Lab Results  Component Value Date   CHOL 160 09/10/2015   CHOL 221 (H) 04/05/2015   CHOL 240 (H) 02/22/2015   Lab Results  Component Value Date   HDL 43 09/10/2015   HDL 48 04/05/2015   HDL 47 02/22/2015   Lab Results  Component Value Date   LDLCALC 90 09/10/2015   LDLCALC 139 (H) 04/05/2015   LDLCALC 164 (H) 02/22/2015   Lab Results  Component Value Date   TRIG 135 09/10/2015   TRIG 171 (H) 04/05/2015   TRIG 147 02/22/2015   Lab Results  Component Value Date   CHOLHDL 3.7 09/10/2015   CHOLHDL 4.6 04/05/2015   CHOLHDL 5.1 (H) 02/22/2015   No results found for: LDLDIRECT  He states he is taking pravastatin consistentlly without asverse effects. We are also monitoring his blood sugar. Lab Results  Component Value Date   HGBA1C 5.7 (H) 09/10/2015       No Known Allergies   Current Outpatient Prescriptions:  .  ALPRAZolam (XANAX) 0.25 MG tablet, TAKE 1 TO 2 TABLETS BY MOUTH EVERY DAY AS NEEDED, Disp: 30 tablet, Rfl: 4 .  ibuprofen (ADVIL,MOTRIN) 200 MG tablet, 3 tablets 2 times daily , Disp: , Rfl:  .  methocarbamol (ROBAXIN) 750 MG tablet, TAKE 2 TABLETS BY MOUTH EVERY 6 HOURS AS NEEDED, Disp: 60 tablet, Rfl: 3 .  pravastatin (PRAVACHOL) 40 MG tablet, TAKE 1 TABLET (40 MG TOTAL) BY MOUTH DAILY., Disp: 90 tablet, Rfl: 4  Review of Systems  Constitutional: Negative for appetite change, chills, diaphoresis,  fatigue and fever.  HENT: Positive for ear pain (left ear).   Respiratory: Negative for chest tightness, shortness of breath and wheezing.   Cardiovascular: Negative for chest pain and palpitations.  Gastrointestinal: Negative for abdominal pain, nausea and vomiting.  Neurological: Positive for headaches. Negative for dizziness and light-headedness.    Social History  Substance Use Topics  . Smoking status: Never Smoker  . Smokeless tobacco: Never Used  . Alcohol use No   Objective:   BP 122/78 (BP Location: Left Arm, Patient Position: Sitting, Cuff Size: Large)   Pulse 65   Temp 97.8 F (36.6 C) (Oral)   Resp 16   Wt 184 lb (83.5 kg)   SpO2 96% Comment: room air  BMI 29.70 kg/m  Vitals:   12/01/16 0946  Resp: 16  Weight: 184 lb (83.5 kg)     Physical Exam  General Appearance:    Alert, cooperative, no distress  HENT:   ENT exam normal, no neck nodes or sinus tenderness, right TM normal without fluid or infection. Left tm dull and bulging, neck without nodes, sinuses nontender and nasal mucosa pale and congested  Eyes:    PERRL, conjunctiva/corneas clear, EOM's intact       Lungs:  Clear to auscultation bilaterally, respirations unlabored  Heart:    Regular rate and rhythm  Neurologic:   Awake, alert, oriented x 3. No apparent focal neurological           defect.           Assessment & Plan:     1. Left otitis media, unspecified otitis media type  - amoxicillin (AMOXIL) 500 MG capsule; Take 2 capsules (1,000 mg total) by mouth 2 (two) times daily.  Dispense: 40 capsule; Refill: 0  2. Hyperlipidemia, unspecified hyperlipidemia type He is tolerating pravastatin well with no adverse effects.   - Lipid panel  3. Prediabetes  - Hemoglobin A1c  4. Eustachian tube dysfunction, left  - fluticasone (FLONASE) 50 MCG/ACT nasal spray; Place 2 sprays into both nostrils daily.  Dispense: 16 g; Refill: 1       Mila Merry, MD  [facility]

## 2016-12-02 DIAGNOSIS — R7303 Prediabetes: Secondary | ICD-10-CM

## 2016-12-02 DIAGNOSIS — H6692 Otitis media, unspecified, left ear: Secondary | ICD-10-CM

## 2016-12-03 ENCOUNTER — Telehealth: Payer: Self-pay

## 2016-12-03 LAB — LIPID PANEL
Chol/HDL Ratio: 5.5 ratio — ABNORMAL HIGH (ref 0.0–5.0)
Cholesterol, Total: 218 mg/dL — ABNORMAL HIGH (ref 100–199)
HDL: 40 mg/dL (ref >39)
TRIGLYCERIDES: 410 mg/dL — AB (ref 0–149)

## 2016-12-03 LAB — HEMOGLOBIN A1C
Est. average glucose Bld gHb Est-mCnc: 126 mg/dL
Hgb A1c MFr Bld: 6 % — ABNORMAL HIGH (ref 4.8–5.6)

## 2016-12-03 NOTE — Telephone Encounter (Signed)
-----   Message from Malva Limesonald E Fisher, MD sent at 12/03/2016  7:36 AM EDT ----- Cholesterol is 218, need to get under 200. Increase pravastatin to 80mg  daily, #30. Average blood sugar is 126, which is 'pre-diabetic' need to strictly avoid sweets and white starches (like white bread, white potatoes and white pasta). Check lipids and a1c in 6 months.

## 2016-12-03 NOTE — Telephone Encounter (Signed)
Spoke with wife and advised of reports she says that patient is on 40mg  and wants to know if he can take two pills daily instead of new Rx being called in? Wife also wants to know if you would be opposed to dietary changes instead of increasing medication?

## 2016-12-03 NOTE — Telephone Encounter (Signed)
OK to take two 40mg tablets instead of one until he needs a refill. He should make some dietary changes in addition to increasing pravastatin. Since his triglycerides are high he should focus on avoiding sugar and white starchy foods.  

## 2016-12-03 NOTE — Telephone Encounter (Signed)
Unable to reach patient on home or mobile number, voicemail is not set up yet. Will try and contact again at a later time. KW

## 2016-12-03 NOTE — Telephone Encounter (Signed)
Wife has been advised. KW 

## 2017-04-08 ENCOUNTER — Other Ambulatory Visit: Payer: Self-pay | Admitting: Family Medicine

## 2017-04-08 NOTE — Telephone Encounter (Signed)
Please call in alprazolam.  

## 2017-04-08 NOTE — Telephone Encounter (Signed)
Rx called in to pharmacy. 

## 2017-11-23 ENCOUNTER — Other Ambulatory Visit: Payer: Self-pay | Admitting: Family Medicine

## 2018-06-16 ENCOUNTER — Other Ambulatory Visit: Payer: Self-pay | Admitting: Family Medicine

## 2018-09-04 ENCOUNTER — Other Ambulatory Visit: Payer: Self-pay | Admitting: Family Medicine

## 2018-10-19 ENCOUNTER — Encounter: Payer: Self-pay | Admitting: Family Medicine

## 2018-10-20 ENCOUNTER — Other Ambulatory Visit: Payer: Self-pay

## 2018-10-20 MED ORDER — ALPRAZOLAM 0.25 MG PO TABS: ORAL_TABLET | ORAL | 2 refills | Status: DC

## 2018-10-20 NOTE — Telephone Encounter (Signed)
Refill requested by patient's wife through her mychart account. Patient's last office visit was 12/01/2016. Please advise if refill is appropriate.

## 2018-12-21 ENCOUNTER — Other Ambulatory Visit: Payer: Self-pay

## 2018-12-21 ENCOUNTER — Ambulatory Visit (INDEPENDENT_AMBULATORY_CARE_PROVIDER_SITE_OTHER): Payer: Self-pay | Admitting: Family Medicine

## 2018-12-21 ENCOUNTER — Encounter: Payer: Self-pay | Admitting: Family Medicine

## 2018-12-21 VITALS — BP 123/84 | HR 78 | Temp 98.5°F | Resp 16 | Ht 66.0 in | Wt 179.0 lb

## 2018-12-21 DIAGNOSIS — E785 Hyperlipidemia, unspecified: Secondary | ICD-10-CM

## 2018-12-21 DIAGNOSIS — R7303 Prediabetes: Secondary | ICD-10-CM

## 2018-12-21 NOTE — Patient Instructions (Addendum)
.   Please review the attached list of medications and notify my office if there are any errors.   . Please bring all of your medications to every appointment so we can make sure that our medication list is the same as yours.   . Please go to the lab draw station in [address] on the second floor of [facility] when you are fasting. Normal hours are 8:00am to 12:30pm and 1:30pm to 4:00pm Monday through Friday   

## 2018-12-21 NOTE — Progress Notes (Signed)
Patient: Evan Anderson, Male    DOB: 1975-10-24, 43 y.o.   MRN: [id number] Visit Date: 12/21/2018  Today's Provider: Mila Merry, MD   Chief Complaint  Patient presents with  . Annual Exam  . Hyperlipidemia  . Hyperglycemia   Subjective:    Lipid/Cholesterol, Follow-up:   Last seen for this 2 years ago.  Management changes since that visit include increasing Pravastatin to  daily. . Last Lipid Panel:    Component Value Date/Time   CHOL 218 (H) 12/02/2016 0838   TRIG 410 (H) 12/02/2016 0838   HDL 40 12/02/2016 0838   CHOLHDL 5.5 (H) 12/02/2016 0838   LDLCALC Comment 12/02/2016 0838    Risk factors for vascular disease include hypercholesterolemia  He reports poor compliance with treatment. Patient has been out of medication for more than 1 year. He states he had severe muscle aches making it difficult to get out of bed when he was taking pravastatin. Was previously tried on lovastatin with similar side effects.  He is not having side effects.  Current symptoms include none and have been stable. Weight trend: fluctuating a bit Prior visit with dietician: no Current diet: well balanced Current exercise: none  Wt Readings from Last 3 Encounters:  12/21/18 179 lb (81.2 kg)  12/01/16 184 lb (83.5 kg)  09/10/15 176 lb 9.6 oz (80.1 kg)    -------------------------------------------------------------------  Prediabetes, Follow-up:   Lab Results  Component Value Date   HGBA1C 6.0 (H) 12/02/2016   HGBA1C 5.7 (H) 09/10/2015   HGBA1C 5.9 (H) 02/22/2015   GLUCOSE 88 01/31/2011    Last seen for for this2 years ago.  Management since that visit includes counseling patient to avoid sweets and starchy foods. Current symptoms include none and have been stable.  Weight trend: fluctuating a bit Prior visit with dietician: no Current diet: well balanced Current exercise: none  Pertinent Labs:    Component Value Date/Time   CHOL 218 (H) 12/02/2016 0838   TRIG 410 (H) 12/02/2016 0838   CHOLHDL 5.5 (H) 12/02/2016 0838   CREATININE 0.9 01/31/2011 1639    Wt Readings from Last 3 Encounters:  12/21/18 179 lb (81.2 kg)  12/01/16 184 lb (83.5 kg)  09/10/15 176 lb 9.6 oz (80.1 kg)    Follow up anxiety Takes alprazolam once or twice every week or two, usually when stress is severe enough to keep him awake at night. Continues to work well without any adverse effects.  Review of Systems  Constitutional: Negative for appetite change, chills, fatigue and fever.  HENT: Negative for congestion, ear pain, hearing loss, nosebleeds and trouble swallowing.   Eyes: Negative for pain and visual disturbance.  Respiratory: Negative for cough, chest tightness and shortness of breath.   Cardiovascular: Negative for chest pain, palpitations and leg swelling.  Gastrointestinal: Negative for abdominal pain, blood in stool, constipation, diarrhea, nausea and vomiting.  Endocrine: Negative for polydipsia, polyphagia and polyuria.  Genitourinary: Negative for dysuria and flank pain.  Musculoskeletal: Negative for arthralgias, back pain, joint swelling, myalgias and neck stiffness.  Skin: Negative for color change, rash and wound.  Neurological: Negative for dizziness, tremors, seizures, speech difficulty, weakness, light-headedness and headaches.  Psychiatric/Behavioral: Negative for behavioral problems, confusion, decreased concentration, dysphoric mood and sleep disturbance. The patient is not nervous/anxious.   All other systems reviewed and are negative.   Social History      He  reports that he has never smoked. He has never used smokeless tobacco. He reports  current alcohol use. He reports that he does not use drugs.       Social History   Socioeconomic History  . Marital status: Married    Spouse name: Not on file  . Number of children: 3  . Years of education: Not on file  . Highest education level: Not on file  Occupational History  . Occupation:  Self employed  Social Needs  . Financial resource strain: Not on file  . Food insecurity:    Worry: Not on file    Inability: Not on file  . Transportation needs:    Medical: Not on file    Non-medical: Not on file  Tobacco Use  . Smoking status: Never Smoker  . Smokeless tobacco: Never Used  Substance and Sexual Activity  . Alcohol use: Yes    Alcohol/week: 0.0 standard drinks    Comment: occasional  . Drug use: No  . Sexual activity: Not on file  Lifestyle  . Physical activity:    Days per week: Not on file    Minutes per session: Not on file  . Stress: Not on file  Relationships  . Social connections:    Talks on phone: Not on file    Gets together: Not on file    Attends religious service: Not on file    Active member of club or organization: Not on file    Attends meetings of clubs or organizations: Not on file    Relationship status: Not on file  Other Topics Concern  . Not on file  Social History Narrative  . Not on file    Past Medical History:  Diagnosis Date  . Hyperlipidemia      Patient Active Problem List   Diagnosis Date Noted  . Abnormal CT of the chest 09/10/2015  . History of hay fever 09/10/2015  . Anxiety 09/10/2015  . Arthralgia 09/10/2015  . Insomnia 09/10/2015  . Myalgia 09/10/2015  . Panic attacks 09/10/2015  . Hyperlipidemia 02/21/2015  . Prediabetes 02/21/2015  . Chest pain 02/01/2011  . Pulmonary infiltrate 02/01/2011    Past Surgical History:  Procedure Laterality Date  . CT scan of Brain  12/06/2010   [facility], normal  . CT scan of chest  07/28/2010   finding which may represcent a component of interstitial infiltrate, infection vs inflammatory. No focal consilidation.   . Stress Echocardiogram  12/10/2010   normal    Family History        Family Status  Relation Name Status  . Mother  Alive  . Father  Alive  . Sister ##Sister1 Alive  . MGF  Deceased  . Brother half- brother Alive        His family history includes  Aneurysm in his maternal grandfather; Anxiety disorder in his father and sister; Arthritis in his mother; Diabetes in his mother; Heart attack (age of onset: 36) in his mother; Heart disease in his mother; Hyperlipidemia in his mother; Hypertension in his mother.      No Known Allergies   Current Outpatient Medications:  .  ALPRAZolam (XANAX) 0.25 MG tablet, TAKE 1 TO 2 TABLETS BY MOUTH DAILY AS NEEDED, Disp: 30 tablet, Rfl: 2 .  ibuprofen (ADVIL,MOTRIN) 200 MG tablet, 3 tablets 2 times daily , Disp: , Rfl:  .  methocarbamol (ROBAXIN) 750 MG tablet, TAKE 2 TABLETS BY MOUTH EVERY 6 HOURS AS NEEDED (Patient not taking: Reported on 12/21/2018), Disp: 60 tablet, Rfl: 3 .  pravastatin (PRAVACHOL) 40 MG tablet, TAKE 1  TABLET (40 MG TOTAL) BY MOUTH DAILY. (Patient not taking: Reported on 12/21/2018), Disp: 90 tablet, Rfl: 4   Patient Care Team: Malva Limes,  E, MD as PCP - General (Family Medicine) Malva Limes,  E, MD as Referring Physician (Family Medicine)    Objective:    Vitals: BP 123/84 (BP Location: Left Arm, Patient Position: Sitting, Cuff Size: Large)   Pulse 78   Temp 98.5 F (36.9 C) (Oral)   Resp 16   Ht 5\' 6"  (1.676 m)   Wt 179 lb (81.2 kg)   SpO2 99% Comment: room air  BMI 28.89 kg/m    Vitals:   12/21/18 1404  BP: 123/84  Pulse: 78  Resp: 16  Temp: 98.5 F (36.9 C)  TempSrc: Oral  SpO2: 99%  Weight: 179 lb (81.2 kg)  Height: 5\' 6"  (1.676 m)     Physical Exam   General Appearance:    Alert, cooperative, no distress, appears stated age  Head:    Normocephalic, without obvious abnormality, atraumatic  Eyes:    PERRL, conjunctiva/corneas clear, EOM's intact, fundi    benign, both eyes       Ears:    Normal TM's and external ear canals, both ears  Nose:   Nares normal, septum midline, mucosa normal, no drainage   or sinus tenderness  Throat:   Lips, mucosa, and tongue normal; teeth and gums normal  Neck:   Supple, symmetrical, trachea midline, no adenopathy;        thyroid:  No enlargement/tenderness/nodules; no carotid   bruit or JVD  Back:     Symmetric, no curvature, ROM normal, no CVA tenderness  Lungs:     Clear to auscultation bilaterally, respirations unlabored  Chest wall:    No tenderness or deformity  Heart:    Regular rate and rhythm, S1 and S2 normal, no murmur, rub   or gallop  Abdomen:     Soft, non-tender, bowel sounds active all four quadrants,    no masses, no organomegaly  Genitalia:    deferred  Rectal:    deferred  Extremities:   Extremities normal, atraumatic, no cyanosis or edema  Pulses:   2+ and symmetric all extremities  Skin:   Skin color, texture, turgor normal, no rashes or lesions  Lymph nodes:   Cervical, supraclavicular, and axillary nodes normal  Neurologic:   CNII-XII intact. Normal strength, sensation and reflexes      throughout    Depression Screen PHQ 2/9 Scores 12/21/2018  PHQ - 2 Score 0  PHQ- 9 Score 0       Assessment & Plan:     Routine Health Maintenance and Physical Exam  Exercise Activities and Dietary recommendations Goals   None     Immunization History  Administered Date(s) Administered  . Tdap 09/06/2010    Health Maintenance  Topic Date Due  . HIV Screening  03/23/1991  . INFLUENZA VACCINE  02/26/2019  . TETANUS/TDAP  09/06/2020     Discussed health benefits of physical activity, and encouraged him to engage in regular exercise appropriate for his age and condition.    --------------------------------------------------------------------  1. Prediabetes  - Hemoglobin A1c  2. Hyperlipidemia, unspecified hyperlipidemia type Intolerant to pravastatin and lovastatin. He report he has been consuming a much healthier diet. Discussed other medication classes if lipids are not to goal.  - Lipid panel    Mila Merryonald , MD  [facility]

## 2018-12-24 ENCOUNTER — Telehealth: Payer: Self-pay

## 2018-12-24 DIAGNOSIS — E785 Hyperlipidemia, unspecified: Secondary | ICD-10-CM

## 2018-12-24 LAB — LIPID PANEL
Chol/HDL Ratio: 5.7 ratio — ABNORMAL HIGH (ref 0.0–5.0)
Cholesterol, Total: 250 mg/dL — ABNORMAL HIGH (ref 100–199)
HDL: 44 mg/dL (ref >39)
LDL Calculated: 159 mg/dL — ABNORMAL HIGH (ref 0–99)
Triglycerides: 234 mg/dL — ABNORMAL HIGH (ref 0–149)
VLDL Cholesterol Cal: 47 mg/dL — ABNORMAL HIGH (ref 5–40)

## 2018-12-24 LAB — HEMOGLOBIN A1C
Est. average glucose Bld gHb Est-mCnc: 120 mg/dL
Hgb A1c MFr Bld: 5.8 % — ABNORMAL HIGH (ref 4.8–5.6)

## 2018-12-24 MED ORDER — EZETIMIBE 10 MG PO TABS: 10.0000 mg | ORAL_TABLET | Freq: Every day | ORAL | 3 refills | Status: DC

## 2018-12-24 NOTE — Telephone Encounter (Signed)
Advised patient's wife of results. Medication was sent into the pharmacy.  

## 2018-12-24 NOTE — Telephone Encounter (Signed)
-----   Message from Malva Limes, MD sent at 12/24/2018  9:29 AM EDT ----- Cholesterol is up to 250. Need to start ezetimibe 10mg  daily, #30, rf x 3. Recheck lipids in 3 months. This is not a statin and should not cause any muscle soreness or weakness a1c is good at  5.8. check yearly.

## 2019-06-28 ENCOUNTER — Other Ambulatory Visit: Payer: Self-pay | Admitting: Family Medicine

## 2019-06-28 NOTE — Telephone Encounter (Signed)
Requested medication (s) are due for refill today: yes  Requested medication (s) are on the active medication list: yes  Last refill:  04/14/2019  Future visit scheduled:  no  Notes to clinic:  Refill cannot be delegated    Requested Prescriptions  Pending Prescriptions Disp Refills   ALPRAZolam (XANAX) 0.25 MG tablet [Pharmacy Med Name: ALPRAZOLAM 0.25 MG TABLET] 30 tablet     Sig: TAKE 1 TO 2 TABLETS BY MOUTH DAILY AS NEEDED     Not Delegated - Psychiatry:  Anxiolytics/Hypnotics Failed - 06/28/2019  8:44 AM      Failed - This refill cannot be delegated      Failed - Urine Drug Screen completed in last 360 days.      Failed - Valid encounter within last 6 months    Recent Outpatient Visits          6 months ago Prediabetes   [facility] Birdie Sons, MD   2 years ago Left otitis media, unspecified otitis media type   [facility] Birdie Sons, MD   3 years ago Hyperlipidemia   [facility] Birdie Sons, MD

## 2019-08-08 ENCOUNTER — Encounter: Payer: Self-pay | Admitting: Family Medicine

## 2019-08-08 MED ORDER — METHOCARBAMOL 750 MG PO TABS: 1500.0000 mg | ORAL_TABLET | Freq: Four times a day (QID) | ORAL | 3 refills | Status: DC | PRN

## 2019-10-25 ENCOUNTER — Other Ambulatory Visit: Payer: Self-pay

## 2019-10-25 ENCOUNTER — Encounter: Payer: Self-pay | Admitting: Family Medicine

## 2019-10-25 ENCOUNTER — Ambulatory Visit (INDEPENDENT_AMBULATORY_CARE_PROVIDER_SITE_OTHER): Payer: Self-pay | Admitting: Family Medicine

## 2019-10-25 VITALS — BP 112/78 | HR 71 | Temp 97.3°F | Resp 16 | Wt 180.0 lb

## 2019-10-25 DIAGNOSIS — M7551 Bursitis of right shoulder: Secondary | ICD-10-CM

## 2019-10-25 NOTE — Progress Notes (Signed)
    Established patient visit      Patient: Evan Anderson   DOB: 09/10/1975   44 y.o. Male  MRN: [id number] Visit Date: 10/25/2019  Today's healthcare provider: Mila Merry, MD  Subjective:    Chief Complaint  Patient presents with  . Shoulder Pain    x 2 months   Shoulder Pain  The pain is present in the right shoulder. This is a new problem. Episode onset: 2 months ago. There has been no history of extremity trauma. The problem occurs intermittently. The problem has been unchanged. Pertinent negatives include no fever. The symptoms are aggravated by activity (ROM within the shoulder joint). He has tried NSAIDS, acetaminophen, heat and cold (OTC topical pain medication) for the symptoms. The treatment provided no relief.        Medications: Outpatient Medications Prior to Visit  Medication Sig  . ALPRAZolam TAKE 1 TO 2 TABLETS BY MOUTH DAILY AS NEEDED  . ibuprofen 3 tablets 2 times daily   . methocarbamol Take 2 tablets (1,500 mg total) by mouth every 6 (six) hours as needed.  . ezetimibe Take 1 tablet (10 mg total) by mouth daily. (Patient not taking: Reported on 10/25/2019)  . pravastatin TAKE 1 TABLET (40 MG TOTAL) BY MOUTH DAILY. (Patient not taking: Reported on 12/21/2018)   No facility-administered medications prior to visit.    Review of Systems  Constitutional: Negative for appetite change, chills and fever.  Respiratory: Negative for chest tightness, shortness of breath and wheezing.   Cardiovascular: Negative for chest pain and palpitations.  Gastrointestinal: Negative for abdominal pain, nausea and vomiting.  Musculoskeletal: Positive for arthralgias (right shoulder) and joint swelling (right shoulder).        Objective:    BP 112/78 (BP Location: Left Arm, Patient Position: Sitting, Cuff Size: Large)   Pulse 71   Temp (!) 97.3 F (36.3 C) (Temporal)   Resp 16   Wt 180 lb (81.6 kg)   SpO2 96% Comment: room air  BMI 29.05 kg/m    Physical Exam    Pain over insertion site of right deltoid and over acromium. FROM of motion, but pain at limits of external rotation. Pain with abduction over 90 degrees.      Assessment & Plan:    1. Bursitis of right shoulder Discussed steroid injection versus orthopedic referral versus physical therapy and that PT would be most likely successful treatment for long term benefit. He declined steroid injection today.   - Ambulatory referral to Physical Therapy      Mila Merry, MD  [facility] [phone] (phone) [phone] (fax)  [facility]

## 2019-10-26 ENCOUNTER — Ambulatory Visit: Payer: Self-pay | Admitting: Family Medicine

## 2019-11-19 ENCOUNTER — Other Ambulatory Visit: Payer: Self-pay | Admitting: Family Medicine

## 2019-11-19 NOTE — Telephone Encounter (Signed)
Requested medication (s) are due for refill today: yes  Requested medication (s) are on the active medication list: yes  Last refill:  06/28/19  Future visit scheduled: yes  Notes to clinic:  medication not delegated to NT to refill   Requested Prescriptions  Pending Prescriptions Disp Refills   ALPRAZolam (XANAX) 0.25 MG tablet [Pharmacy Med Name: ALPRAZOLAM 0.25 MG TABLET] 30 tablet 1    Sig: TAKE 1 TO 2 TABLETS BY MOUTH DAILY AS NEEDED      Not Delegated - Psychiatry:  Anxiolytics/Hypnotics Failed - 11/19/2019 11:11 AM      Failed - This refill cannot be delegated      Failed - Urine Drug Screen completed in last 360 days.      Passed - Valid encounter within last 6 months    Recent Outpatient Visits           3 weeks ago Bursitis of right shoulder   [facility] Malva Limes, MD   11 months ago Prediabetes   [facility] Malva Limes, MD   2 years ago Left otitis media, unspecified otitis media type   [facility] Malva Limes, MD   4 years ago Hyperlipidemia   [facility] Malva Limes, MD

## 2019-12-30 ENCOUNTER — Ambulatory Visit (INDEPENDENT_AMBULATORY_CARE_PROVIDER_SITE_OTHER): Payer: Self-pay | Admitting: Family Medicine

## 2019-12-30 ENCOUNTER — Encounter: Payer: Self-pay | Admitting: Family Medicine

## 2019-12-30 ENCOUNTER — Other Ambulatory Visit: Payer: Self-pay

## 2019-12-30 VITALS — BP 122/80 | HR 53 | Temp 97.3°F | Resp 16 | Ht 66.0 in | Wt 181.0 lb

## 2019-12-30 DIAGNOSIS — Z0000 Encounter for general adult medical examination without abnormal findings: Secondary | ICD-10-CM

## 2019-12-30 DIAGNOSIS — R7303 Prediabetes: Secondary | ICD-10-CM

## 2019-12-30 DIAGNOSIS — F39 Unspecified mood [affective] disorder: Secondary | ICD-10-CM

## 2019-12-30 DIAGNOSIS — E785 Hyperlipidemia, unspecified: Secondary | ICD-10-CM

## 2019-12-30 DIAGNOSIS — F419 Anxiety disorder, unspecified: Secondary | ICD-10-CM

## 2019-12-30 MED ORDER — CITALOPRAM HYDROBROMIDE 20 MG PO TABS: ORAL_TABLET | ORAL | 3 refills | Status: DC

## 2019-12-30 NOTE — Patient Instructions (Signed)
.   The presence of Covid-19 antibodies is not an indication of immunity to Covid. People with Covid antibodies can still contract Covid and transmit Covid to other people.

## 2019-12-30 NOTE — Progress Notes (Signed)
Complete physical exam   Patient: Evan Anderson   DOB: [birth year]   44 y.o. Male  MRN: [id number] Visit Date: 12/30/2019  Today's healthcare provider: Lelon Huh, MD   Chief Complaint  Patient presents with  . Annual Exam  . Hyperlipidemia  . Hyperglycemia   Subjective    Evan Anderson is a 44 y.o. male who presents today for a complete physical exam.  He reports consuming a general diet. The patient does not participate in regular exercise at present. He generally feels fairly well. He reports sleeping fairly well. He does not have additional problems to discuss today.  HPI  Lipid/Cholesterol, Follow-up  Last lipid panel Other pertinent labs  Lab Results  Component Value Date   CHOL 250 (H) 12/23/2018   HDL 44 12/23/2018   LDLCALC 159 (H) 12/23/2018   TRIG 234 (H) 12/23/2018   CHOLHDL 5.7 (H) 12/23/2018   Lab Results  Component Value Date   ALT 37 09/10/2015   AST 25 09/10/2015   PLT 223.0 01/31/2011     He was last seen for this 1 years ago.  Management since that visit includes starting ezetimibe 10mg  daily.  He reports poor compliance with treatment. Patient stopped taking Ezetimibe 2 months after starting it due to it causing body aches. He is not having side effects.   Symptoms: No chest pain No chest pressure/discomfort  No dyspnea No lower extremity edema  No numbness or tingling of extremity No orthopnea  No palpitations No paroxysmal nocturnal dyspnea  No speech difficulty No syncope   Current diet: in general, an "unhealthy" diet Current exercise: none  The 10-year ASCVD risk score Mikey Bussing DC Jr., et al., 2013) is: 2.6%  ---------------------------------------------------------------------------------------------------  Prediabetes, Follow-up  Lab Results  Component Value Date   HGBA1C 5.8 (H) 12/23/2018   HGBA1C 6.0 (H) 12/02/2016   HGBA1C 5.7 (H) 09/10/2015   GLUCOSE 88 01/31/2011    Last seen for for this1 years ago.  Management  since that visit includes no changes. Current symptoms include none and have been stable.  Prior visit with dietician: no Current diet: in general, an "unhealthy" diet Current exercise: none  Pertinent Labs:    Component Value Date/Time   CHOL 250 (H) 12/23/2018 0809   TRIG 234 (H) 12/23/2018 0809   CHOLHDL 5.7 (H) 12/23/2018 0809   CREATININE 0.9 01/31/2011 1639    Wt Readings from Last 3 Encounters:  12/30/19 181 lb (82.1 kg)  10/25/19 180 lb (81.6 kg)  12/21/18 179 lb (81.2 kg)    ----------------------------------------------------------------------------------------- His wife is with him today and reports that the patient has been very emotional lately. 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 Has episodes of depression and racing thoughts. Has a lot of trouble concentrating and staying focused. Takes alprazolam to help sleep a few times every week, but feels like he may need something to help with depression.    Past Medical History:  Diagnosis Date  . Hyperlipidemia    Past Surgical History:  Procedure Laterality Date  . CT scan of Brain  12/06/2010   [facility], normal  . CT scan of chest  07/28/2010   finding which may represcent a component of interstitial infiltrate, infection vs inflammatory. No focal consilidation.   . Stress Echocardiogram  12/10/2010   normal   Social History   Socioeconomic History  . Marital status: Married    Spouse name: Not on file  .  Number of children: 3  . Years of education: Not on file  . Highest education level: Not on file  Occupational History  . Occupation: Self employed  Tobacco Use  . Smoking status: Never Smoker  . Smokeless tobacco: Never Used  Substance and Sexual Activity  . Alcohol use: Yes    Alcohol/week: 0.0 standard drinks    Comment: occasional  . Drug use: No  . Sexual activity: Not on file  Other Topics Concern  . Not on file  Social History Narrative  . Not  on file   Social Determinants of Health   Financial Resource Strain:   . Difficulty of Paying Living Expenses:   Food Insecurity:   . Worried About Programme researcher, broadcasting/film/video in the Last Year:   . Barista in the Last Year:   Transportation Needs:   . Freight forwarder (Medical):   Marland Kitchen Lack of Transportation (Non-Medical):   Physical Activity:   . Days of Exercise per Week:   . Minutes of Exercise per Session:   Stress:   . Feeling of Stress :   Social Connections:   . Frequency of Communication with Friends and Family:   . Frequency of Social Gatherings with Friends and Family:   . Attends Religious Services:   . Active Member of Clubs or Organizations:   . Attends Banker Meetings:   Marland Kitchen Marital Status:   Intimate Partner Violence:   . Fear of Current or Ex-Partner:   . Emotionally Abused:   Marland Kitchen Physically Abused:   . Sexually Abused:    Family Status  Relation Name Status  . Mother  Alive  . Father  Alive  . Sister  Alive  . MGF  Deceased  . Brother half- brother Alive       high cholestero   Family History  Problem Relation Age of Onset  . Heart disease Mother   . Hypertension Mother   . Heart attack Mother 68  . Arthritis Mother        RA  . Diabetes Mother        Type 2  . Hyperlipidemia Mother   . Anxiety disorder Father   . Anxiety disorder Sister   . Aneurysm Maternal Grandfather        brain  . Diabetes Brother   . High Cholesterol Brother    No Known Allergies  Patient Care Team: Malva Limes, MD as PCP - General (Family Medicine) Sherrie Mustache Demetrios Isaacs, MD as Referring Physician (Family Medicine)   Medications: Outpatient Medications Prior to Visit  Medication Sig  . ALPRAZolam (XANAX) 0.25 MG tablet TAKE 1 TO 2 TABLETS BY MOUTH DAILY AS NEEDED  . ezetimibe (ZETIA) 10 MG tablet Take 1 tablet (10 mg total) by mouth daily. (Patient not taking: Reported on 10/25/2019)  . ibuprofen (ADVIL,MOTRIN) 200 MG tablet 3 tablets 2 times daily    . methocarbamol (ROBAXIN) 750 MG tablet Take 2 tablets (1,500 mg total) by mouth every 6 (six) hours as needed.  . [DISCONTINUED] pravastatin (PRAVACHOL) 40 MG tablet TAKE 1 TABLET (40 MG TOTAL) BY MOUTH DAILY. (Patient not taking: Reported on 12/21/2018)   No facility-administered medications prior to visit.    Review of Systems  Constitutional: Negative for appetite change, chills, fatigue and fever.  HENT: Negative for congestion, ear pain, hearing loss, nosebleeds and trouble swallowing.   Eyes: Negative for pain and visual disturbance.  Respiratory: Negative for cough, chest tightness and shortness of  breath.   Cardiovascular: Negative for chest pain, palpitations and leg swelling.  Gastrointestinal: Negative for abdominal pain, blood in stool, constipation, diarrhea, nausea and vomiting.  Endocrine: Negative for polydipsia, polyphagia and polyuria.  Genitourinary: Negative for dysuria and flank pain.  Musculoskeletal: Negative for arthralgias, back pain, joint swelling, myalgias and neck stiffness.  Skin: Negative for color change, rash and wound.  Neurological: Negative for dizziness, tremors, seizures, speech difficulty, weakness, light-headedness and headaches.  Psychiatric/Behavioral: Negative for behavioral problems, confusion, decreased concentration, dysphoric mood and sleep disturbance. The patient is not nervous/anxious.   All other systems reviewed and are negative.    Objective    BP 122/80   Pulse (!) 53   Temp (!) 97.3 F (36.3 C) (Temporal)   Resp 16   Ht 5\' 6"  (1.676 m)   Wt 181 lb (82.1 kg)   BMI 29.21 kg/m   Physical Exam   General Appearance:     Overweight male. Alert, cooperative, in no acute distress, appears stated age  Head:    Normocephalic, without obvious abnormality, atraumatic  Eyes:    PERRL, conjunctiva/corneas clear, EOM's intact, fundi    benign, both eyes       Ears:    Normal TM's and external ear canals, both ears  Nose:   Nares  normal, septum midline, mucosa normal, no drainage   or sinus tenderness  Throat:   Lips, mucosa, and tongue normal; teeth and gums normal  Neck:   Supple, symmetrical, trachea midline, no adenopathy;       thyroid:  No enlargement/tenderness/nodules; no carotid   bruit or JVD  Back:     Symmetric, no curvature, ROM normal, no CVA tenderness  Lungs:     Clear to auscultation bilaterally, respirations unlabored  Chest wall:    No tenderness or deformity  Heart:    Bradycardic. Normal rhythm. No murmurs, rubs, or gallops.  S1 and S2 normal  Abdomen:     Soft, non-tender, bowel sounds active all four quadrants,    no masses, no organomegaly  Genitalia:    deferred  Rectal:    deferred  Extremities:   All extremities are intact. No cyanosis or edema  Pulses:   2+ and symmetric all extremities  Skin:   Skin color, texture, turgor normal, no rashes or lesions  Lymph nodes:   Cervical, supraclavicular, and axillary nodes normal  Neurologic:   CNII-XII intact. Normal strength, sensation and reflexes      throughout     Depression Screen  PHQ 2/9 Scores 12/30/2019 12/21/2018  PHQ - 2 Score 0 0  PHQ- 9 Score - 0    No results found for any visits on 12/30/19.  Assessment & Plan    Routine Health Maintenance and Physical Exam  Exercise Activities and Dietary recommendations Goals   None     Immunization History  Administered Date(s) Administered  . Tdap 09/06/2010    Health Maintenance  Topic Date Due  . HIV Screening  Never done  . INFLUENZA VACCINE  02/26/2020  . TETANUS/TDAP  09/06/2020    Discussed health benefits of physical activity, and encouraged him to engage in regular exercise appropriate for his age and condition.  1. Annual physical exam Mildly overweight, otherwise unremarkable exam. Patients wife requests blood type and covid antibiotic testing. Patient and his wife were extensively counseled that the presence of covid antibiotic are not a reliable indicator of  immunity and should always wear mask in public regardless of results unless  they get vaccinated. She expresses understanding but still request ab testing.  - ABO AND RH  - SAR CoV2 Serology (COVID 19)AB(IGG)IA  2. Hyperlipidemia, unspecified hyperlipidemia type Intolerant to Zetia due to myalgia. May need to try a different statin.  - Comprehensive metabolic panel - Direct LDL - CBC  3. Prediabetes  - Hemoglobin A1c  4. Anxiety Does well with occasionally alprazolam to help stleep.   5. Mood disorder (HCC)  - citalopram (CELEXA) 20 MG tablet; 1/2 daily for 1 week, then increase to 1 tablet daily  Dispense: 30 tablet; Refill: 3        The entirety of the information documented in the History of Present Illness, Review of Systems and Physical Exam were personally obtained by me. Portions of this information were initially documented by the CMA and reviewed by me for thoroughness and accuracy.      Mila Merry, MD  [facility] [phone] (phone) [phone] (fax)  [facility]

## 2019-12-31 LAB — COMPREHENSIVE METABOLIC PANEL
ALT: 41 IU/L (ref 0–44)
AST: 29 IU/L (ref 0–40)
Albumin/Globulin Ratio: 1.8 (ref 1.2–2.2)
Albumin: 4.5 g/dL (ref 4.0–5.0)
Alkaline Phosphatase: 125 IU/L — ABNORMAL HIGH (ref 48–121)
BUN/Creatinine Ratio: 15 (ref 9–20)
BUN: 14 mg/dL (ref 6–24)
Bilirubin Total: 0.3 mg/dL (ref 0.0–1.2)
CO2: 21 mmol/L (ref 20–29)
Calcium: 9 mg/dL (ref 8.7–10.2)
Chloride: 104 mmol/L (ref 96–106)
Creatinine, Ser: 0.92 mg/dL (ref 0.76–1.27)
GFR calc Af Amer: 117 mL/min/{1.73_m2} (ref >59)
GFR calc non Af Amer: 102 mL/min/{1.73_m2} (ref >59)
Globulin, Total: 2.5 g/dL (ref 1.5–4.5)
Glucose: 90 mg/dL (ref 65–99)
Potassium: 4.4 mmol/L (ref 3.5–5.2)
Sodium: 142 mmol/L (ref 134–144)
Total Protein: 7 g/dL (ref 6.0–8.5)

## 2019-12-31 LAB — CBC
Hematocrit: 44.9 % (ref 37.5–51.0)
Hemoglobin: 15.2 g/dL (ref 13.0–17.7)
MCH: 29.7 pg (ref 26.6–33.0)
MCHC: 33.9 g/dL (ref 31.5–35.7)
MCV: 88 fL (ref 79–97)
Platelets: 225 10*3/uL (ref 150–450)
RBC: 5.12 x10E6/uL (ref 4.14–5.80)
RDW: 13.1 % (ref 11.6–15.4)
WBC: 5.6 10*3/uL (ref 3.4–10.8)

## 2019-12-31 LAB — SAR COV2 SEROLOGY (COVID19)AB(IGG),IA: DiaSorin SARS-CoV-2 Ab, IgG: NEGATIVE

## 2019-12-31 LAB — HEMOGLOBIN A1C
Est. average glucose Bld gHb Est-mCnc: 120 mg/dL
Hgb A1c MFr Bld: 5.8 % — ABNORMAL HIGH (ref 4.8–5.6)

## 2019-12-31 LAB — ABO AND RH: Rh Factor: POSITIVE

## 2019-12-31 LAB — LDL CHOLESTEROL, DIRECT: LDL Direct: 157 mg/dL — ABNORMAL HIGH (ref 0–99)

## 2020-06-08 ENCOUNTER — Other Ambulatory Visit: Payer: Self-pay | Admitting: Family Medicine

## 2020-06-08 NOTE — Telephone Encounter (Signed)
Requested medication (s) are due for refill today - yes  Requested medication (s) are on the active medication list -yes  Future visit scheduled -no  Last refill: 01/29/20  Notes to clinic: Request RF non delegated Rx  Requested Prescriptions  Pending Prescriptions Disp Refills   ALPRAZolam (XANAX) 0.25 MG tablet [Pharmacy Med Name: ALPRAZOLAM 0.25 MG TABLET] 30 tablet     Sig: TAKE 1 TO 2 TABLETS BY MOUTH DAILY AS NEEDED      Not Delegated - Psychiatry:  Anxiolytics/Hypnotics Failed - 06/08/2020  9:51 AM      Failed - This refill cannot be delegated      Failed - Urine Drug Screen completed in last 360 days      Passed - Valid encounter within last 6 months    Recent Outpatient Visits           5 months ago Annual physical exam   [facility] Malva Limes, MD   7 months ago Bursitis of right shoulder   [facility] Malva Limes, MD   1 year ago Prediabetes   [facility] Malva Limes, MD   3 years ago Left otitis media, unspecified otitis media type   [facility] Malva Limes, MD   4 years ago Hyperlipidemia   [facility] Malva Limes, MD                  Requested Prescriptions  Pending Prescriptions Disp Refills   ALPRAZolam (XANAX) 0.25 MG tablet [Pharmacy Med Name: ALPRAZOLAM 0.25 MG TABLET] 30 tablet     Sig: TAKE 1 TO 2 TABLETS BY MOUTH DAILY AS NEEDED      Not Delegated - Psychiatry:  Anxiolytics/Hypnotics Failed - 06/08/2020  9:51 AM      Failed - This refill cannot be delegated      Failed - Urine Drug Screen completed in last 360 days      Passed - Valid encounter within last 6 months    Recent Outpatient Visits           5 months ago Annual physical exam   [facility] Malva Limes, MD   7 months ago Bursitis of right shoulder   [facility] Malva Limes, MD   1 year ago Prediabetes   [facility]  Malva Limes, MD   3 years ago Left otitis media, unspecified otitis media type   [facility] Malva Limes, MD   4 years ago Hyperlipidemia   [facility] Malva Limes, MD

## 2020-08-07 ENCOUNTER — Other Ambulatory Visit: Payer: Self-pay | Admitting: Family Medicine

## 2020-08-07 DIAGNOSIS — R7303 Prediabetes: Secondary | ICD-10-CM

## 2020-08-07 DIAGNOSIS — E785 Hyperlipidemia, unspecified: Secondary | ICD-10-CM

## 2020-08-24 ENCOUNTER — Telehealth: Payer: Self-pay

## 2020-08-24 NOTE — Telephone Encounter (Signed)
Copied from CRM [id number]. Topic: Complaint - Billing/Coding >> Aug 24, 2020 11:26 AM Randol Kern wrote: DOS: 08/07/2020 Details of complaint: Pt's wife called reporting that patient was charged for an $99 dollar CT Heart without contrast media. The patient was never seen at this visit, and he never had this CT scan.  How would the patient like to see this issue resolved? Pt's wife is requesting a call back from leadership to have this resolved. Please advise   Best contact: [phone]  Route to Practice Administrator.

## 2020-08-27 ENCOUNTER — Telehealth: Payer: Self-pay

## 2020-08-27 NOTE — Telephone Encounter (Signed)
Patient's wife has called in for clarity and discussion, requesting to be called back at [phone]

## 2020-08-27 NOTE — Telephone Encounter (Signed)
Patient was suppose to be scheduled for a Ct Heart w/o contrast media - Quant Eval Coronary Calcium but no one has called him with appt time and it looks like it was never sent to Referrals.   Pt wife is wanting this done at the same time as hers Banker)

## 2020-08-27 NOTE — Telephone Encounter (Signed)
Dr. Sherrie Mustache, it looks like this was ordered on 08/07/2020. Patient was never contacted about an appt. Also, there is a message by the patient's wife that he was charged $99 for this test and he never got it done. From what I can tell, there are no results or anything showing that he had this done. Very confusing. As message was sent to Maralyn Sago Public affairs consultant) about this as well. Just FYI.

## 2020-08-27 NOTE — Telephone Encounter (Signed)
Maralyn Sago,   Do you know what's up with this. I placed an order for a coronary calcium scan but no one every called him to schedule. Looks like he was charged for the scan even though it was never done or even scheduled. Is there a different order that should have been placed?

## 2020-08-28 NOTE — Telephone Encounter (Signed)
I can't explain this. It does not look like it fell in WQ and does not look like it was scheduled

## 2020-08-29 NOTE — Telephone Encounter (Signed)
Can it be scheduled for him. Thanks.

## 2020-08-30 ENCOUNTER — Other Ambulatory Visit: Payer: Self-pay | Admitting: Family Medicine

## 2020-08-30 DIAGNOSIS — E785 Hyperlipidemia, unspecified: Secondary | ICD-10-CM

## 2020-08-30 DIAGNOSIS — R7303 Prediabetes: Secondary | ICD-10-CM

## 2020-08-30 NOTE — Telephone Encounter (Signed)
Pt is scheduled at [facility] [place] location) for 09/11/20 at 1:30. I am waiting on return call on wife's work number to advise since they wanted test done on same day.I think test you are looking for is CT cardiac scoring which is [id number]. The order has been fixed . If this is not test you wanted please let me know

## 2020-09-11 ENCOUNTER — Other Ambulatory Visit: Payer: Self-pay

## 2020-09-11 ENCOUNTER — Ambulatory Visit
Admission: RE | Admit: 2020-09-11 | Discharge: 2020-09-11 | Disposition: A | Discharge status: Home / Self Care | Payer: Self-pay | Source: Ambulatory Visit | Attending: Family Medicine | Admitting: Family Medicine

## 2020-09-11 DIAGNOSIS — R7303 Prediabetes: Secondary | ICD-10-CM | POA: Insufficient documentation

## 2020-09-11 DIAGNOSIS — E785 Hyperlipidemia, unspecified: Secondary | ICD-10-CM | POA: Insufficient documentation

## 2021-01-01 ENCOUNTER — Other Ambulatory Visit: Payer: Self-pay

## 2021-01-01 ENCOUNTER — Encounter: Payer: Self-pay | Admitting: Family Medicine

## 2021-01-01 ENCOUNTER — Ambulatory Visit (INDEPENDENT_AMBULATORY_CARE_PROVIDER_SITE_OTHER): Payer: Self-pay | Admitting: Family Medicine

## 2021-01-01 VITALS — BP 125/81 | HR 61 | Temp 98.4°F | Resp 16 | Ht 66.0 in | Wt 170.0 lb

## 2021-01-01 DIAGNOSIS — Z0000 Encounter for general adult medical examination without abnormal findings: Secondary | ICD-10-CM

## 2021-01-01 DIAGNOSIS — R7303 Prediabetes: Secondary | ICD-10-CM

## 2021-01-01 DIAGNOSIS — E785 Hyperlipidemia, unspecified: Secondary | ICD-10-CM

## 2021-01-01 DIAGNOSIS — F419 Anxiety disorder, unspecified: Secondary | ICD-10-CM

## 2021-01-01 MED ORDER — ALPRAZOLAM 0.25 MG PO TABS: 0.2500 mg | ORAL_TABLET | Freq: Every evening | ORAL | 3 refills | Status: DC | PRN

## 2021-01-01 NOTE — Patient Instructions (Addendum)
You are due for a Tdap (tetanus-diptheria-pertussis vaccine) which protects you from tetanus and whooping cough. Please check with your insurance plan or pharmacy regarding coverage for this vaccine.   . Covid-19 vaccines: The Covid vaccines have been given to hundreds of millions of people and found to be very effective and are as safe as any other vaccine.  The [employer] vaccine has been associated with very rare dangerous blood clots, but only in adult women under the age of 34.  The risk of dying from Covid infections is much higher than having a serious reaction to the vaccine.  I strongly recommend getting fully vaccinated against Covid-19.  I recommend that adult women under 60 get fully vaccinated, but the [place] and [employer] vaccines may be safer for those women than the [employer] vaccine.

## 2021-01-01 NOTE — Progress Notes (Signed)
Complete physical exam   Patient: Evan Anderson   DOB: [birth year]   45 y.o. Male  MRN: [id number] Visit Date: 01/01/2021  Today's healthcare provider: Mila Merry, MD   Chief Complaint  Patient presents with  . Annual Exam  . Anxiety  . Prediabetes  . Hyperlipidemia   Subjective    Evan Anderson is a 45 y.o. male who presents today for a complete physical exam.  He reports consuming a general diet. Home exercise routine includes running. He generally feels fairly well. He reports sleeping fairly well. He does not have additional problems to discuss today.  HPI  Prediabetes, Follow-up  Lab Results  Component Value Date   HGBA1C 5.8 (H) 12/30/2019   HGBA1C 5.8 (H) 12/23/2018   HGBA1C 6.0 (H) 12/02/2016   GLUCOSE 90 12/30/2019   GLUCOSE 88 01/31/2011    Last seen for for this 1 year ago.  Management since that visit includes encouraging patient to follow a healthy diet.. Current symptoms include none and have been stable.  Prior visit with dietician: no Current diet: well balanced Current exercise: running/ jogging  Pertinent Labs:    Component Value Date/Time   CHOL 250 (H) 12/23/2018 0809   TRIG 234 (H) 12/23/2018 0809   CHOLHDL 5.7 (H) 12/23/2018 0809   CREATININE 0.92 12/30/2019 1543    Wt Readings from Last 3 Encounters:  01/01/21 170 lb (77.1 kg)  12/30/19 181 lb (82.1 kg)  10/25/19 180 lb (81.6 kg)    ----------------------------------------------------------------------------------------- Anxiety, Follow-up  He was last seen for anxiety 1 year ago. Changes made at last visit include none; continue occasionally alprazolam to help sleep .   He reports good compliance with treatment. He reports good tolerance of treatment. He is not having side effects.   He feels his anxiety is mild and Unchanged since last visit.  Symptoms: No chest pain No difficulty concentrating  No dizziness No fatigue  No feelings of losing control No insomnia  No  irritable No palpitations  No panic attacks No racing thoughts  No shortness of breath No sweating  No tremors/shakes    GAD-7 Results No flowsheet data found.  PHQ-9 Scores PHQ9 SCORE ONLY 01/01/2021 12/30/2019 12/21/2018  PHQ-9 Total Score 0 0 0    --------------------------------------------------------------------------------------------------- Lipid/Cholesterol, Follow-up  Last lipid panel Other pertinent labs  Lab Results  Component Value Date   CHOL 250 (H) 12/23/2018   HDL 44 12/23/2018   LDLCALC 159 (H) 12/23/2018   LDLDIRECT 157 (H) 12/30/2019   TRIG 234 (H) 12/23/2018   CHOLHDL 5.7 (H) 12/23/2018   Lab Results  Component Value Date   ALT 41 12/30/2019   AST 29 12/30/2019   PLT 225 12/30/2019     He was last seen for this 1 year ago.  Management since that visit includes encouraging patient to avoid saturated fats like red meats as much as possible.  He reports good compliance with treatment. He is not having side effects.   Symptoms: No chest pain No chest pressure/discomfort  No dyspnea No lower extremity edema  No numbness or tingling of extremity No orthopnea  No palpitations No paroxysmal nocturnal dyspnea  No speech difficulty No syncope   Current diet: well balanced Current exercise: running/ jogging  The 10-year ASCVD risk score Denman George DC Montez Hageman., et al., 2013) is: 3%  ---------------------------------------------------------------------------------------------------  Past Medical History:  Diagnosis Date  . Hyperlipidemia    Past Surgical History:  Procedure Laterality Date  . CT scan of Brain  12/06/2010   [facility], normal  . CT scan of chest  07/28/2010   finding which may represcent a component of interstitial infiltrate, infection vs inflammatory. No focal consilidation.   . Stress Echocardiogram  12/10/2010   normal   Social History   Socioeconomic History  . Marital status: Married    Spouse name: Not on file  . Number of children: 3   . Years of education: Not on file  . Highest education level: Not on file  Occupational History  . Occupation: Self employed  Tobacco Use  . Smoking status: Never Smoker  . Smokeless tobacco: Never Used  Substance and Sexual Activity  . Alcohol use: Yes    Alcohol/week: 0.0 standard drinks    Comment: occasional  . Drug use: No  . Sexual activity: Not on file  Other Topics Concern  . Not on file  Social History Narrative  . Not on file   Social Determinants of Health   Financial Resource Strain: Not on file  Food Insecurity: Not on file  Transportation Needs: Not on file  Physical Activity: Not on file  Stress: Not on file  Social Connections: Not on file  Intimate Partner Violence: Not on file   Family Status  Relation Name Status  . Mother  Alive  . Father  Alive  . Sister  Alive  . MGF  Deceased  . Brother half- brother Alive       high cholestero   Family History  Problem Relation Age of Onset  . Heart disease Mother   . Hypertension Mother   . Heart attack Mother 32  . Arthritis Mother        RA  . Diabetes Mother        Type 2  . Hyperlipidemia Mother   . Anxiety disorder Father   . Anxiety disorder Sister   . Aneurysm Maternal Grandfather        brain  . Diabetes Brother   . High Cholesterol Brother    No Known Allergies  Patient Care Team: Malva Limes, MD as PCP - General (Family Medicine) Sherrie Mustache Demetrios Isaacs, MD as Referring Physician (Family Medicine)   Medications: Outpatient Medications Prior to Visit  Medication Sig  . ALPRAZolam (XANAX) 0.25 MG tablet TAKE 1 TO 2 TABLETS BY MOUTH DAILY AS NEEDED  . [DISCONTINUED] citalopram (CELEXA) 20 MG tablet 1/2 daily for 1 week, then increase to 1 tablet daily (Patient not taking: Reported on 01/01/2021)  . [DISCONTINUED] ibuprofen (ADVIL,MOTRIN) 200 MG tablet 3 tablets 2 times daily   . [DISCONTINUED] methocarbamol (ROBAXIN) 750 MG tablet Take 2 tablets (1,500 mg total) by mouth every 6 (six)  hours as needed.   No facility-administered medications prior to visit.    Review of Systems  Constitutional: Negative for appetite change, chills, fatigue and fever.  HENT: Negative for congestion, ear pain, hearing loss, nosebleeds and trouble swallowing.   Eyes: Negative for pain and visual disturbance.  Respiratory: Negative for cough, chest tightness and shortness of breath.   Cardiovascular: Negative for chest pain, palpitations and leg swelling.  Gastrointestinal: Negative for abdominal pain, blood in stool, constipation, diarrhea, nausea and vomiting.  Endocrine: Negative for polydipsia, polyphagia and polyuria.  Genitourinary: Negative for dysuria and flank pain.  Musculoskeletal: Negative for arthralgias, back pain, joint swelling, myalgias and neck stiffness.  Skin: Negative for color change, rash and wound.  Neurological: Negative for dizziness, tremors, seizures, speech difficulty, weakness, light-headedness and headaches.  Psychiatric/Behavioral: Negative for behavioral  problems, confusion, decreased concentration, dysphoric mood and sleep disturbance. The patient is not nervous/anxious.   All other systems reviewed and are negative.     Objective    BP 125/81   Pulse 61   Temp 98.4 F (36.9 C) (Temporal)   Resp 16   Ht 5\' 6"  (1.676 m)   Wt 170 lb (77.1 kg)   BMI 27.44 kg/m    Physical Exam    General Appearance:     Well developed, well nourished male. Alert, cooperative, in no acute distress, appears stated age  Head:    Normocephalic, without obvious abnormality, atraumatic  Eyes:    PERRL, conjunctiva/corneas clear, EOM's intact, fundi    benign, both eyes       Ears:    Normal TM's and external ear canals, both ears  Neck:   Supple, symmetrical, trachea midline, no adenopathy;       thyroid:  No enlargement/tenderness/nodules; no carotid   bruit or JVD  Back:     Symmetric, no curvature, ROM normal, no CVA tenderness  Lungs:     Clear to auscultation  bilaterally, respirations unlabored  Chest wall:    No tenderness or deformity  Heart:    Normal heart rate. Normal rhythm. No murmurs, rubs, or gallops.  S1 and S2 normal  Abdomen:     Soft, non-tender, bowel sounds active all four quadrants,    no masses, no organomegaly  Genitalia:    deferred  Rectal:    deferred  Extremities:   All extremities are intact. No cyanosis or edema  Pulses:   2+ and symmetric all extremities  Skin:   Skin color, texture, turgor normal, no rashes or lesions  Lymph nodes:   Cervical, supraclavicular, and axillary nodes normal  Neurologic:   CNII-XII intact. Normal strength, sensation and reflexes      throughout     Last depression screening scores PHQ 2/9 Scores 01/01/2021 12/30/2019 12/21/2018  PHQ - 2 Score 0 0 0  PHQ- 9 Score 0 - 0   Last fall risk screening Fall Risk  01/01/2021  Falls in the past year? 0  Number falls in past yr: 0  Injury with Fall? 0  Follow up Falls evaluation completed   Last Audit-C alcohol use screening Alcohol Use Disorder Test (AUDIT) 01/01/2021  1. How often do you have a drink containing alcohol? 0  2. How many drinks containing alcohol do you have on a typical day when you are drinking? 0  3. How often do you have six or more drinks on one occasion? 0  AUDIT-C Score 0   A score of 3 or more in women, and 4 or more in men indicates increased risk for alcohol abuse, EXCEPT if all of the points are from question 1   No results found for any visits on 01/01/21.  Assessment & Plan    Routine Health Maintenance and Physical Exam  Exercise Activities and Dietary recommendations Goals   None     Immunization History  Administered Date(s) Administered  . Tdap 09/06/2010    Health Maintenance  Topic Date Due  . Pneumococcal Vaccine 360-45 Years old (1 of 2 - PPSV23) Never done  . HIV Screening  Never done  . Hepatitis C Screening  Never done  . TETANUS/TDAP  09/06/2020  . INFLUENZA VACCINE  02/25/2021  . Zoster  Vaccines- Shingrix (1 of 2) 03/22/2026  . HPV VACCINES  Aged Out    Discussed health benefits of physical  activity, and encouraged him to engage in regular exercise appropriate for his age and condition.  1. Annual physical exam Normal exam.  - SARS-CoV-2 Antibodies  2. Prediabetes  - Hemoglobin A1c  3. Hyperlipidemia, unspecified hyperlipidemia type Diet controlled.  - Comprehensive metabolic panel - CBC - Lipid panel  4. Anxiety Does well with occasional ALPRAZolam (XANAX) 0.25 MG tablet; Take 1-2 tablets (0.25-0.5 mg total) by mouth at bedtime as needed.  Dispense: 30 tablet; Refill: 3   Recommend covid vaccine. Advised he is due for tetanus vaccine. He declined today but will consider getting it at his pharmacy.     The entirety of the information documented in the History of Present Illness, Review of Systems and Physical Exam were personally obtained by me. Portions of this information were initially documented by the CMA and reviewed by me for thoroughness and accuracy.      Mila Merry, MD  [facility] [phone] (phone) [phone] (fax)  [facility]

## 2021-01-03 LAB — HEMOGLOBIN A1C
Est. average glucose Bld gHb Est-mCnc: 126 mg/dL
Hgb A1c MFr Bld: 6 % — ABNORMAL HIGH (ref 4.8–5.6)

## 2021-01-03 LAB — COMPREHENSIVE METABOLIC PANEL
ALT: 26 IU/L (ref 0–44)
AST: 19 IU/L (ref 0–40)
Albumin/Globulin Ratio: 1.3 (ref 1.2–2.2)
Albumin: 4.3 g/dL (ref 4.0–5.0)
Alkaline Phosphatase: 128 IU/L — ABNORMAL HIGH (ref 44–121)
BUN/Creatinine Ratio: 12 (ref 9–20)
BUN: 10 mg/dL (ref 6–24)
Bilirubin Total: 0.5 mg/dL (ref 0.0–1.2)
CO2: 24 mmol/L (ref 20–29)
Calcium: 9.5 mg/dL (ref 8.7–10.2)
Chloride: 102 mmol/L (ref 96–106)
Creatinine, Ser: 0.84 mg/dL (ref 0.76–1.27)
Globulin, Total: 3.2 g/dL (ref 1.5–4.5)
Glucose: 97 mg/dL (ref 65–99)
Potassium: 4.9 mmol/L (ref 3.5–5.2)
Sodium: 140 mmol/L (ref 134–144)
Total Protein: 7.5 g/dL (ref 6.0–8.5)
eGFR: 110 mL/min/{1.73_m2} (ref >59)

## 2021-01-03 LAB — LIPID PANEL
Chol/HDL Ratio: 5.6 ratio — ABNORMAL HIGH (ref 0.0–5.0)
Cholesterol, Total: 196 mg/dL (ref 100–199)
HDL: 35 mg/dL — ABNORMAL LOW (ref >39)
LDL Chol Calc (NIH): 126 mg/dL — ABNORMAL HIGH (ref 0–99)
Triglycerides: 195 mg/dL — ABNORMAL HIGH (ref 0–149)
VLDL Cholesterol Cal: 35 mg/dL (ref 5–40)

## 2021-01-03 LAB — CBC
Hematocrit: 48 % (ref 37.5–51.0)
Hemoglobin: 16 g/dL (ref 13.0–17.7)
MCH: 29 pg (ref 26.6–33.0)
MCHC: 33.3 g/dL (ref 31.5–35.7)
MCV: 87 fL (ref 79–97)
Platelets: 245 10*3/uL (ref 150–450)
RBC: 5.52 x10E6/uL (ref 4.14–5.80)
RDW: 13.1 % (ref 11.6–15.4)
WBC: 5 10*3/uL (ref 3.4–10.8)

## 2021-01-03 LAB — SARS-COV-2 ANTIBODIES: SARS-CoV-2 Antibodies: NEGATIVE

## 2021-06-12 ENCOUNTER — Other Ambulatory Visit: Payer: Self-pay | Admitting: Family Medicine

## 2021-06-12 DIAGNOSIS — F419 Anxiety disorder, unspecified: Secondary | ICD-10-CM

## 2021-06-13 NOTE — Telephone Encounter (Signed)
Requested medications are due for refill today.  yes  Requested medications are on the active medications list.  yes  Last refill. 01/01/2021  Future visit scheduled.   no  Notes to clinic.  Medication not delegated. 

## 2021-12-01 ENCOUNTER — Other Ambulatory Visit: Payer: Self-pay | Admitting: Family Medicine

## 2021-12-01 DIAGNOSIS — F419 Anxiety disorder, unspecified: Secondary | ICD-10-CM

## 2022-01-29 ENCOUNTER — Encounter: Payer: Self-pay | Admitting: Family Medicine

## 2022-01-29 ENCOUNTER — Ambulatory Visit (INDEPENDENT_AMBULATORY_CARE_PROVIDER_SITE_OTHER): Payer: Self-pay | Admitting: Family Medicine

## 2022-01-29 VITALS — BP 114/84 | HR 60 | Temp 97.7°F | Resp 14 | Ht 66.0 in | Wt 180.0 lb

## 2022-01-29 DIAGNOSIS — Z23 Encounter for immunization: Secondary | ICD-10-CM

## 2022-01-29 DIAGNOSIS — Z1211 Encounter for screening for malignant neoplasm of colon: Secondary | ICD-10-CM

## 2022-01-29 DIAGNOSIS — E785 Hyperlipidemia, unspecified: Secondary | ICD-10-CM

## 2022-01-29 DIAGNOSIS — R7303 Prediabetes: Secondary | ICD-10-CM

## 2022-01-29 DIAGNOSIS — Z0000 Encounter for general adult medical examination without abnormal findings: Secondary | ICD-10-CM

## 2022-01-29 DIAGNOSIS — Z8249 Family history of ischemic heart disease and other diseases of the circulatory system: Secondary | ICD-10-CM

## 2022-01-29 NOTE — Progress Notes (Signed)
I,Roshena L Chambers,acting as a scribe for Mila Merry, MD.,have documented all relevant documentation on the behalf of Mila Merry, MD,as directed by  Mila Merry, MD while in the presence of Mila Merry, MD.   Complete physical exam   Patient: Evan Anderson   DOB: 1976/06/10   45 y.o. Male  MRN: [id number] Visit Date: 01/29/2022  Today's healthcare provider: Mila Merry, MD   Chief Complaint  Patient presents with   Annual Exam   Hyperlipidemia   Anxiety   Prediabetes   Subjective    Evan Anderson is a 46 y.o. male who presents today for a complete physical exam.  He reports consuming a general diet. The patient does not participate in regular exercise at present. He generally feels fairly well. He reports sleeping fairly well. He does not have additional problems to discuss today.  HPI  Lipid/Cholesterol, Follow-up  Last lipid panel Other pertinent labs  Lab Results  Component Value Date   CHOL 196 01/02/2021   HDL 35 (L) 01/02/2021   LDLCALC 126 (H) 01/02/2021   LDLDIRECT 157 (H) 12/30/2019   TRIG 195 (H) 01/02/2021   CHOLHDL 5.6 (H) 01/02/2021   Lab Results  Component Value Date   ALT 26 01/02/2021   AST 19 01/02/2021   PLT 245 01/02/2021     He was last seen for this 1  year  ago.  Management since that visit includes continuing lifestyle modifications.  He reports good compliance with treatment. He is not having side effects.   Symptoms: No chest pain No chest pressure/discomfort  No dyspnea No lower extremity edema  No numbness or tingling of extremity No orthopnea  No palpitations No paroxysmal nocturnal dyspnea  No speech difficulty No syncope   Current diet: well balanced Current exercise: none  The 10-year ASCVD risk score (Arnett DK, et al., 2019) is: 2.5%  ---------------------------------------------------------------------------------------------------   Prediabetes, Follow-up  Lab Results  Component Value Date   HGBA1C 6.0  (H) 01/02/2021   HGBA1C 5.8 (H) 12/30/2019   HGBA1C 5.8 (H) 12/23/2018   GLUCOSE 97 01/02/2021   GLUCOSE 90 12/30/2019   GLUCOSE 88 01/31/2011    Last seen for for this1  year  ago.  Management since that visit includes continuing lifestyle modifications. Current symptoms include none and have been stable.  Prior visit with dietician: no Current diet: well balanced Current exercise: none  Pertinent Labs:    Component Value Date/Time   CHOL 196 01/02/2021 0805   TRIG 195 (H) 01/02/2021 0805   CHOLHDL 5.6 (H) 01/02/2021 0805   CREATININE 0.84 01/02/2021 0805    Wt Readings from Last 3 Encounters:  01/29/22 180 lb (81.6 kg)  01/01/21 170 lb (77.1 kg)  12/30/19 181 lb (82.1 kg)    -----------------------------------------------------------------------------------------   Anxiety, Follow-up  He was last seen for anxiety 1  year  ago. Changes made at last visit include none; continue as needed alprazolam, which he typically types 3-4 nights a week to help relax and sleep better.    He reports good compliance with treatment. He reports good tolerance of treatment. He is not having side effects.   He feels his anxiety is mild and Improved since last visit.  Symptoms: No chest pain No difficulty concentrating  No dizziness No fatigue  No feelings of losing control No insomnia  No irritable No palpitations  No panic attacks No racing thoughts  No shortness of breath No sweating  No tremors/shakes    GAD-7 Results  No data to display          PHQ-9 Scores    01/29/2022    9:07 AM 01/01/2021    2:10 PM 12/30/2019    2:10 PM  PHQ9 SCORE ONLY  PHQ-9 Total Score 0 0 0    ---------------------------------------------------------------------------------------------------   Past Medical History:  Diagnosis Date   Hyperlipidemia    Past Surgical History:  Procedure Laterality Date   CT scan of Brain  12/06/2010   [facility], normal   CT scan of chest  07/28/2010    finding which may represcent a component of interstitial infiltrate, infection vs inflammatory. No focal consilidation.    Stress Echocardiogram  12/10/2010   normal   Social History   Socioeconomic History   Marital status: Married    Spouse name: Not on file   Number of children: 3   Years of education: Not on file   Highest education level: Not on file  Occupational History   Occupation: Self employed  Tobacco Use   Smoking status: Never   Smokeless tobacco: Never  Substance and Sexual Activity   Alcohol use: Yes    Alcohol/week: 0.0 standard drinks of alcohol    Comment: occasional   Drug use: No   Sexual activity: Not on file  Other Topics Concern   Not on file  Social History Narrative   Not on file   Social Determinants of Health   Financial Resource Strain: Not on file  Food Insecurity: Not on file  Transportation Needs: Not on file  Physical Activity: Not on file  Stress: Not on file  Social Connections: Not on file  Intimate Partner Violence: Not on file   Family Status  Relation Name Status   Mother  Alive   Father  Alive   Sister  Alive   MGF  Deceased   Brother half- brother Alive       high cholestero   Family History  Problem Relation Age of Onset   Heart disease Mother    Hypertension Mother    Heart attack Mother 13   Arthritis Mother        RA   Diabetes Mother        Type 2   Hyperlipidemia Mother    Anxiety disorder Father    Anxiety disorder Sister    Aneurysm Maternal Grandfather        brain   Diabetes Brother    High Cholesterol Brother    No Known Allergies  Patient Care Team: Malva Limes, MD as PCP - General (Family Medicine) Sherrie Mustache, Demetrios Isaacs, MD as Referring Physician (Family Medicine)   Medications: Outpatient Medications Prior to Visit  Medication Sig   ALPRAZolam (XANAX) 0.25 MG tablet TAKE 1-2 TABLETS (0.25-0.5 MG TOTAL) BY MOUTH AT BEDTIME AS NEEDED.   No facility-administered medications prior to visit.     Review of Systems  Constitutional:  Negative for appetite change, chills, fatigue and fever.  HENT:  Negative for congestion, ear pain, hearing loss, nosebleeds and trouble swallowing.   Eyes:  Negative for pain and visual disturbance.  Respiratory:  Negative for cough, chest tightness and shortness of breath.   Cardiovascular:  Negative for chest pain, palpitations and leg swelling.  Gastrointestinal:  Negative for abdominal pain, blood in stool, constipation, diarrhea, nausea and vomiting.  Endocrine: Negative for polydipsia, polyphagia and polyuria.  Genitourinary:  Negative for dysuria and flank pain.  Musculoskeletal:  Negative for arthralgias, back pain, joint swelling, myalgias and neck  stiffness.  Skin:  Negative for color change, rash and wound.  Neurological:  Negative for dizziness, tremors, seizures, speech difficulty, weakness, light-headedness and headaches.  Psychiatric/Behavioral:  Negative for behavioral problems, confusion, decreased concentration, dysphoric mood and sleep disturbance. The patient is not nervous/anxious.   All other systems reviewed and are negative.     Objective     BP 114/84 (BP Location: Right Arm, Patient Position: Sitting, Cuff Size: Large)   Pulse 60   Temp 97.7 F (36.5 C) (Oral)   Resp 14   Ht 5\' 6"  (1.676 m)   Wt 180 lb (81.6 kg)   SpO2 98% Comment: room air  BMI 29.05 kg/m     Physical Exam   General Appearance:    Well developed, well nourished male. Alert, cooperative, in no acute distress, appears stated age  Head:    Normocephalic, without obvious abnormality, atraumatic  Eyes:    PERRL, conjunctiva/corneas clear, EOM's intact, fundi    benign, both eyes       Ears:    Normal TM's and external ear canals, both ears  Nose:   Nares normal, septum midline, mucosa normal, no drainage   or sinus tenderness  Throat:   Lips, mucosa, and tongue normal; teeth and gums normal  Neck:   Supple, symmetrical, trachea midline, no  adenopathy;       thyroid:  No enlargement/tenderness/nodules; no carotid   bruit or JVD  Back:     Symmetric, no curvature, ROM normal, no CVA tenderness  Lungs:     Clear to auscultation bilaterally, respirations unlabored  Chest wall:    No tenderness or deformity  Heart:    Normal heart rate. Normal rhythm. No murmurs, rubs, or gallops.  S1 and S2 normal  Abdomen:     Soft, non-tender, bowel sounds active all four quadrants,    no masses, no organomegaly  Genitalia:    deferred  Rectal:    deferred  Extremities:   All extremities are intact. No cyanosis or edema  Pulses:   2+ and symmetric all extremities  Skin:   Skin color, texture, turgor normal, no rashes or lesions  Lymph nodes:   Cervical, supraclavicular, and axillary nodes normal  Neurologic:   CNII-XII intact. Normal strength, sensation and reflexes      throughout     Last depression screening scores    01/29/2022    9:07 AM 01/01/2021    2:10 PM 12/30/2019    2:10 PM  PHQ 2/9 Scores  PHQ - 2 Score 0 0 0  PHQ- 9 Score 0 0    Last fall risk screening    01/01/2021    2:11 PM  Fall Risk   Falls in the past year? 0  Number falls in past yr: 0  Injury with Fall? 0  Follow up Falls evaluation completed   Last Audit-C alcohol use screening    01/01/2021    2:11 PM  Alcohol Use Disorder Test (AUDIT)  1. How often do you have a drink containing alcohol? 0  2. How many drinks containing alcohol do you have on a typical day when you are drinking? 0  3. How often do you have six or more drinks on one occasion? 0  AUDIT-C Score 0   A score of 3 or more in women, and 4 or more in men indicates increased risk for alcohol abuse, EXCEPT if all of the points are from question 1   No results found for any  visits on 01/29/22.  Assessment & Plan    Routine Health Maintenance and Physical Exam  Exercise Activities and Dietary recommendations  Goals   None     Immunization History  Administered Date(s) Administered    Tdap 09/06/2010    Health Maintenance  Topic Date Due   HIV Screening  Never done   Hepatitis C Screening  Never done   TETANUS/TDAP  09/06/2020   COLONOSCOPY (Pts 45-74yrs Insurance coverage will need to be confirmed)  Never done   INFLUENZA VACCINE  02/25/2022   HPV VACCINES  Aged Out    Discussed health benefits of physical activity, and encouraged him to engage in regular exercise appropriate for his age and condition.   2. Family history of premature CAD   3. Hyperlipidemia, unspecified hyperlipidemia type Currently diet controlled. Discussed avoiding red meats, pork, and dark poultry.   - Comprehensive metabolic panel - Lipid panel  4. Prediabetes  - Hemoglobin A1c  5. Colon cancer screening  - Fecal occult blood, imunochemical  6. Need for vaccine for Td (tetanus-diphtheria)  - Administer Tetanus-diphtheria (Td) vaccine     The entirety of the information documented in the History of Present Illness, Review of Systems and Physical Exam were personally obtained by me. Portions of this information were initially documented by the CMA and reviewed by me for thoroughness and accuracy.     Mila Merry, MD  [facility] [phone] (phone) [phone] (fax)  [facility]

## 2022-01-30 LAB — COMPREHENSIVE METABOLIC PANEL
ALT: 60 IU/L — ABNORMAL HIGH (ref 0–44)
AST: 31 IU/L (ref 0–40)
Albumin/Globulin Ratio: 1.7 (ref 1.2–2.2)
Albumin: 4.5 g/dL (ref 4.0–5.0)
Alkaline Phosphatase: 127 IU/L — ABNORMAL HIGH (ref 44–121)
BUN/Creatinine Ratio: 17 (ref 9–20)
BUN: 15 mg/dL (ref 6–24)
Bilirubin Total: 0.6 mg/dL (ref 0.0–1.2)
CO2: 22 mmol/L (ref 20–29)
Calcium: 9.4 mg/dL (ref 8.7–10.2)
Chloride: 100 mmol/L (ref 96–106)
Creatinine, Ser: 0.86 mg/dL (ref 0.76–1.27)
Globulin, Total: 2.7 g/dL (ref 1.5–4.5)
Glucose: 98 mg/dL (ref 70–99)
Potassium: 4.6 mmol/L (ref 3.5–5.2)
Sodium: 139 mmol/L (ref 134–144)
Total Protein: 7.2 g/dL (ref 6.0–8.5)
eGFR: 109 mL/min/{1.73_m2} (ref >59)

## 2022-01-30 LAB — LIPID PANEL
Chol/HDL Ratio: 6.1 ratio — ABNORMAL HIGH (ref 0.0–5.0)
Cholesterol, Total: 232 mg/dL — ABNORMAL HIGH (ref 100–199)
HDL: 38 mg/dL — ABNORMAL LOW (ref >39)
LDL Chol Calc (NIH): 141 mg/dL — ABNORMAL HIGH (ref 0–99)
Triglycerides: 292 mg/dL — ABNORMAL HIGH (ref 0–149)
VLDL Cholesterol Cal: 53 mg/dL — ABNORMAL HIGH (ref 5–40)

## 2022-01-30 LAB — HEMOGLOBIN A1C
Est. average glucose Bld gHb Est-mCnc: 120 mg/dL
Hgb A1c MFr Bld: 5.8 % — ABNORMAL HIGH (ref 4.8–5.6)

## 2022-02-07 ENCOUNTER — Encounter: Payer: Self-pay | Admitting: Family Medicine

## 2022-02-21 ENCOUNTER — Encounter: Payer: Self-pay | Admitting: Family Medicine

## 2022-05-06 ENCOUNTER — Other Ambulatory Visit: Payer: Self-pay | Admitting: Family Medicine

## 2022-05-06 DIAGNOSIS — F419 Anxiety disorder, unspecified: Secondary | ICD-10-CM

## 2022-07-20 ENCOUNTER — Ambulatory Visit: Admit: 2022-07-20 | Discharge status: Against Medical Advice | Payer: Self-pay

## 2022-07-23 ENCOUNTER — Encounter: Payer: Self-pay | Admitting: Family Medicine

## 2022-07-23 ENCOUNTER — Ambulatory Visit (INDEPENDENT_AMBULATORY_CARE_PROVIDER_SITE_OTHER): Payer: Self-pay | Admitting: Family Medicine

## 2022-07-23 VITALS — BP 109/79 | HR 75 | Temp 97.8°F | Wt 181.8 lb

## 2022-07-23 DIAGNOSIS — H66003 Acute suppurative otitis media without spontaneous rupture of ear drum, bilateral: Secondary | ICD-10-CM | POA: Insufficient documentation

## 2022-07-23 MED ORDER — AMOXICILLIN-POT CLAVULANATE 875-125 MG PO TABS: 1.0000 | ORAL_TABLET | Freq: Two times a day (BID) | ORAL | 0 refills | Status: DC

## 2022-07-23 MED ORDER — MONTELUKAST SODIUM 10 MG PO TABS: 10.0000 mg | ORAL_TABLET | Freq: Every day | ORAL | 0 refills | Status: DC

## 2022-07-23 NOTE — Progress Notes (Signed)
I,Connie R Striblin,acting as a Neurosurgeon for Jacky Kindle, FNP.,have documented all relevant documentation on the behalf of Jacky Kindle, FNP,as directed by  Jacky Kindle, FNP while in the presence of Jacky Kindle, FNP.  Established patient visit  Patient: Evan Anderson   DOB: 1976-03-02   46 y.o. Male  MRN: [id number] Visit Date: 07/23/2022  Today's healthcare provider: Jacky Kindle, FNP  Introduced to nurse practitioner role and practice setting.  All questions answered.  Discussed provider/patient relationship and expectations.  Subjective    HPI  Cough: Patient complains of cough. Symptoms began 5 days ago. Cough described as productive of green/yellow sputum. Patient denies dyspnea. Associated symptoms include chills, fever, headache  , nasal congestion, sneezing, and sweats.  Current treatments have included  OTC cough medication , with good improvement.  Pt has been taking, spouses amoxicillin 875mg  pt has had a total of 4 pills Pt spouse denied covid and flu testing.    Medications: Outpatient Medications Prior to Visit  Medication Sig   ALPRAZolam (XANAX) 0.25 MG tablet TAKE 1-2 TABLETS (0.25-0.5 MG TOTAL) BY MOUTH AT BEDTIME AS NEEDED.   No facility-administered medications prior to visit.   Review of Systems    Objective    BP 109/79 (BP Location: Right Arm, Patient Position: Sitting, Cuff Size: Normal)   Pulse 75   Temp 97.8 F (36.6 C) (Oral)   Wt 181 lb 12.8 oz (82.5 kg)   SpO2 98%   BMI 29.34 kg/m   Physical Exam Vitals and nursing note reviewed.  Constitutional:      General: He is not in acute distress.    Appearance: Normal appearance. He is obese. He is not ill-appearing, toxic-appearing or diaphoretic.  HENT:     Head: Normocephalic and atraumatic.     Right Ear: External ear normal. Swelling and tenderness present. There is no impacted cerumen. Tympanic membrane is erythematous.     Left Ear: External ear normal. Swelling and tenderness present.  There is no impacted cerumen. Tympanic membrane is erythematous.     Nose: Congestion and rhinorrhea present.     Right Sinus: Maxillary sinus tenderness present. No frontal sinus tenderness.     Left Sinus: Maxillary sinus tenderness present. No frontal sinus tenderness.     Mouth/Throat:     Mouth: Mucous membranes are moist.     Pharynx: Oropharynx is clear. Posterior oropharyngeal erythema present. No oropharyngeal exudate.  Eyes:     Pupils: Pupils are equal, round, and reactive to light.  Cardiovascular:     Rate and Rhythm: Normal rate and regular rhythm.     Pulses: Normal pulses.     Heart sounds: Normal heart sounds.  Pulmonary:     Effort: Pulmonary effort is normal.     Breath sounds: Normal breath sounds.  Musculoskeletal:        General: Normal range of motion.     Cervical back: Normal range of motion.  Skin:    General: Skin is warm and dry.     Capillary Refill: Capillary refill takes less than 2 seconds.  Neurological:     General: No focal deficit present.     Mental Status: He is alert and oriented to person, place, and time.  Psychiatric:        Mood and Affect: Mood normal.        Behavior: Behavior normal.        Thought Content: Thought content normal.  Judgment: Judgment normal.      No results found for any visits on 07/23/22.  Assessment & Plan     Problem List Items Addressed This Visit       Nervous and Auditory   Non-recurrent acute suppurative otitis media of both ears without spontaneous rupture of tympanic membranes - Primary    Acute on chronic, improving in past few days d/t "extra abx" found at home Advised against taking other medications and educated on importance of complete Abx use to prevent Abx resistance and concern for super infection requiring hospitalization Augmentin to assist as well as singular qHS for the ongoing "tickle" due to drainage.  Restart home Allegra OTC to assist      Relevant Medications    amoxicillin-clavulanate (AUGMENTIN) 875-125 MG tablet   Return if symptoms worsen or fail to improve.     Leilani Merl, FNP, have reviewed all documentation for this visit. The documentation on 07/23/22 for the exam, diagnosis, procedures, and orders are all accurate and complete.  Jacky Kindle, FNP  [facility] [phone] (phone) [phone] (fax)  [facility]

## 2022-07-23 NOTE — Assessment & Plan Note (Signed)
Acute on chronic, improving in past few days d/t "extra abx" found at home Advised against taking other medications and educated on importance of complete Abx use to prevent Abx resistance and concern for super infection requiring hospitalization Augmentin to assist as well as singular qHS for the ongoing "tickle" due to drainage.  Restart home Allegra OTC to assist

## 2022-08-08 ENCOUNTER — Encounter: Payer: Self-pay | Admitting: Family Medicine

## 2022-08-08 ENCOUNTER — Ambulatory Visit: Payer: Self-pay | Admitting: Family Medicine

## 2022-08-08 ENCOUNTER — Ambulatory Visit: Payer: Self-pay

## 2022-08-08 VITALS — BP 115/78 | HR 69 | Temp 98.0°F | Resp 16 | Wt 181.1 lb

## 2022-08-08 DIAGNOSIS — J3489 Other specified disorders of nose and nasal sinuses: Secondary | ICD-10-CM

## 2022-08-08 DIAGNOSIS — R058 Other specified cough: Secondary | ICD-10-CM

## 2022-08-08 MED ORDER — PREDNISONE 10 MG PO TABS: ORAL_TABLET | ORAL | 0 refills | Status: DC

## 2022-08-08 NOTE — Progress Notes (Signed)
I,Sulibeya S Dimas,acting as a Education administrator for Lavon Paganini, MD.,have documented all relevant documentation on the behalf of Lavon Paganini, MD,as directed by  Lavon Paganini, MD while in the presence of Lavon Paganini, MD.     Established patient visit   Patient: Evan Anderson   DOB: [birth year]   47 y.o. Male  MRN: [id number] Visit Date: 08/08/2022  Today's healthcare provider: Lavon Paganini, MD   Chief Complaint  Patient presents with   Cough   Subjective    HPI  Patient C/O swollen lymph node in neck and persistent cough x 45 days. Patient's wife thinks patient has had flu, RSV, and Covid over the past 45 days. Reports cough with thick yellow mucous, scratchy throat and headache. Patient was last treated with ABX on 07/23/22 started on Augmentin. Patient reports getting better while on antibiotics.   Medications: Outpatient Medications Prior to Visit  Medication Sig   ALPRAZolam (XANAX) 0.25 MG tablet TAKE 1-2 TABLETS (0.25-0.5 MG TOTAL) BY MOUTH AT BEDTIME AS NEEDED.   amoxicillin-clavulanate (AUGMENTIN) 875-125 MG tablet Take 1 tablet by mouth 2 (two) times daily.   montelukast (SINGULAIR) 10 MG tablet Take 1 tablet (10 mg total) by mouth at bedtime.   No facility-administered medications prior to visit.    Review of Systems  Constitutional:  Positive for chills. Negative for fatigue.  HENT:  Positive for congestion, ear pain, postnasal drip, rhinorrhea, sinus pressure, sinus pain, sore throat, trouble swallowing and voice change.   Respiratory:  Positive for cough. Negative for chest tightness, shortness of breath and wheezing.   Gastrointestinal:  Negative for abdominal pain, nausea and vomiting.  Neurological:  Positive for headaches.       Objective    BP 115/78 (BP Location: Right Arm, Patient Position: Sitting, Cuff Size: Large)   Pulse 69   Temp 98 F (36.7 C) (Temporal)   Resp 16   Wt 181 lb 1.6 oz (82.1 kg)   SpO2 97%   BMI 29.23 kg/m  BP  Readings from Last 3 Encounters:  08/08/22 115/78  07/23/22 109/79  01/29/22 114/84   Wt Readings from Last 3 Encounters:  08/08/22 181 lb 1.6 oz (82.1 kg)  07/23/22 181 lb 12.8 oz (82.5 kg)  01/29/22 180 lb (81.6 kg)      Physical Exam Vitals reviewed.  Constitutional:      General: He is not in acute distress.    Appearance: Normal appearance. He is not diaphoretic.  HENT:     Head: Normocephalic and atraumatic.     Right Ear: Tympanic membrane, ear canal and external ear normal.     Left Ear: Tympanic membrane, ear canal and external ear normal.     Nose: Nose normal.     Mouth/Throat:     Mouth: Mucous membranes are moist.     Pharynx: Oropharynx is clear.  Eyes:     General: No scleral icterus.    Conjunctiva/sclera: Conjunctivae normal.  Cardiovascular:     Rate and Rhythm: Normal rate and regular rhythm.     Heart sounds: Normal heart sounds. No murmur heard. Pulmonary:     Effort: Pulmonary effort is normal. No respiratory distress.     Breath sounds: Normal breath sounds. No wheezing or rhonchi.  Musculoskeletal:     Cervical back: Neck supple.     Right lower leg: No edema.     Left lower leg: No edema.  Lymphadenopathy:     Cervical: No cervical adenopathy.  Skin:  General: Skin is warm and dry.  Neurological:     Mental Status: He is alert and oriented to person, place, and time. Mental status is at baseline.  Psychiatric:        Mood and Affect: Mood normal.        Behavior: Behavior normal.       No results found for any visits on 08/08/22.  Assessment & Plan     1. Sinus pressure 2. Post-viral cough syndrome - recent episodes of viral illnesses x3 and AOM - symptoms and exam c/w post-viral symptoms - no evidence of strep pharyngitis, CAP, AOM, bacterial sinusitis, or other bacterial infection - discussed symptomatic management, natural course, and return precautions   - no indication for imaging at this time - advised to use mucinex,  flonase, allegra, and honey for cough - will send prednisone to pharmacy to hold in case he is not improving in another week  Return if symptoms worsen or fail to improve.      I, Lavon Paganini, MD, have reviewed all documentation for this visit. The documentation on 08/08/22 for the exam, diagnosis, procedures, and orders are all accurate and complete.   Yamili Lichtenwalner, Dionne Bucy, MD, MPH [facility]

## 2022-08-08 NOTE — Telephone Encounter (Signed)
  Chief Complaint: Swollen lymph node in neck/cough Symptoms: Scratchy throat, congestion cough, HA Frequency: 45 days Pertinent Negatives: Patient denies SOB Disposition: [] ED /[] Urgent Care (no appt availability in office) / [x] Appointment(In office/virtual)/ []  [place] [facility]/ [] Home Care/ [] Refused Recommended Disposition /[] [place] [facility]/ []  Follow-up with PCP Additional Notes: S/w pt's wife. She states that pt has been sick with cough for 45 days. Wife thinks that pt has had, flu, Rsv and covid over the past 45 days. Currently pt has swollen lymph node, A cough with thick yellow mucous, scratchy throat, and HA. Pt was seen recently and given antibiotics. Pt started to get better, but after antibiotics ended, pt got worse.    Summary: lymph nodes swollen in neck   Seen 2 weeks ago and prescribed antibiotics / taken all of the meds but now lymph nodes are swollen in neck , throat is sore and a really bad cough that he has had for 45 days / pt also had a headache / pts wife asked if he needs another round of prednisone or antibiotics / please advise / wife stated it got better and then it seems like he went backwards       Reason for Disposition  [1] Large node AND [2] present > 2 weeks  Answer Assessment - Initial Assessment Questions 1. LOCATION: "Where is the swollen node located?" "Is the matching node on the other side of the body also swollen?"      Throat 2. SIZE: "How big is the node?" (e.g., inches or centimeters; or compared to common objects such as pea, bean, marble, golf ball)      Can be seen  3. ONSET: "When did the swelling start?"      Quite awhile 4. NECK NODES: "Is there a sore throat, runny nose or other symptoms of a cold?"      Sore throat, Cough, congestion, HA, Thick yellow mucous 5. GROIN OR ARMPIT NODES: "Is there a sore, scratch, cut or painful red area on that arm or leg?"      no 6. FEVER: "Do you have a fever?" If Yes, ask: "What is it,  how was it measured, and when did it start?"      no 7. CAUSE: "What do you think is causing the swollen lymph nodes?"     Unsure 8. OTHER SYMPTOMS: "Do you have any other symptoms?"     HA, cough, congestion HA, scratchy throat 9. PREGNANCY: "Is there any chance you are pregnant?" "When was your last menstrual period?"  Protocols used: Lymph Nodes - Swollen-A-AH

## 2022-08-19 ENCOUNTER — Other Ambulatory Visit: Payer: Self-pay | Admitting: Family Medicine

## 2022-08-19 DIAGNOSIS — H66003 Acute suppurative otitis media without spontaneous rupture of ear drum, bilateral: Secondary | ICD-10-CM

## 2022-10-12 ENCOUNTER — Other Ambulatory Visit: Payer: Self-pay | Admitting: Family Medicine

## 2022-10-12 DIAGNOSIS — F419 Anxiety disorder, unspecified: Secondary | ICD-10-CM

## 2023-05-04 ENCOUNTER — Encounter: Payer: Self-pay | Admitting: Family Medicine

## 2023-05-08 ENCOUNTER — Other Ambulatory Visit: Payer: Self-pay | Admitting: Family Medicine

## 2023-05-08 DIAGNOSIS — F419 Anxiety disorder, unspecified: Secondary | ICD-10-CM

## 2023-06-08 ENCOUNTER — Ambulatory Visit: Payer: Self-pay | Admitting: Family Medicine

## 2023-06-08 ENCOUNTER — Encounter: Payer: Self-pay | Admitting: Family Medicine

## 2023-06-08 VITALS — BP 111/77 | HR 58 | Ht 64.57 in | Wt 177.0 lb

## 2023-06-08 DIAGNOSIS — R7303 Prediabetes: Secondary | ICD-10-CM

## 2023-06-08 DIAGNOSIS — E785 Hyperlipidemia, unspecified: Secondary | ICD-10-CM

## 2023-06-08 DIAGNOSIS — Z1211 Encounter for screening for malignant neoplasm of colon: Secondary | ICD-10-CM

## 2023-06-08 DIAGNOSIS — F410 Panic disorder [episodic paroxysmal anxiety] without agoraphobia: Secondary | ICD-10-CM

## 2023-06-08 DIAGNOSIS — F419 Anxiety disorder, unspecified: Secondary | ICD-10-CM

## 2023-06-08 MED ORDER — ALPRAZOLAM 0.25 MG PO TABS: 0.2500 mg | ORAL_TABLET | Freq: Every evening | ORAL | 5 refills | Status: DC | PRN

## 2023-06-08 NOTE — Progress Notes (Signed)
Complete physical exam   Patient: Evan Anderson   DOB: 09/30/1975   47 y.o. Male  MRN: [id number] Visit Date: 06/08/2023  Today's healthcare provider: Mila Merry, MD   Chief Complaint  Patient presents with   Annual Exam   Subjective    Discussed the use of AI scribe software for clinical note transcription with the patient, who gave verbal consent to proceed.  History of Present Illness   The patient, with a history of hyperlipidemia and anxiety, presents for a routine physical. He reports discontinuing his statin due to side effects but has been adhering to a low-fat diet. He continues to take alprazolam, which he finds effective for his anxiety, particularly at night. However, he notes that his current dosage is insufficient to last the entire month, as he has been taking one tablet at night every night.  The patient reports no issues with vision or hearing, and no symptoms suggestive of cardiovascular or respiratory disease. He does, however, report a sensation of spinning when standing up. Despite this, his physical examination was unremarkable.  The patient has not completed his colon cancer screening, despite having received a home stool test kit the previous year. He expresses a willingness to complete the test this year.         Past Medical History:  Diagnosis Date   Hyperlipidemia    Past Surgical History:  Procedure Laterality Date   CT scan of Brain  12/06/2010   [facility], normal   CT scan of chest  07/28/2010   finding which may represcent a component of interstitial infiltrate, infection vs inflammatory. No focal consilidation.    Stress Echocardiogram  12/10/2010   normal   Social History   Socioeconomic History   Marital status: Married    Spouse name: Not on file   Number of children: 3   Years of education: Not on file   Highest education level: Not on file  Occupational History   Occupation: Self employed  Tobacco Use   Smoking status: Never    Smokeless tobacco: Never  Substance and Sexual Activity   Alcohol use: Yes    Alcohol/week: 0.0 standard drinks of alcohol    Comment: occasional   Drug use: No   Sexual activity: Not on file  Other Topics Concern   Not on file  Social History Narrative   Not on file   Social Determinants of Health   Financial Resource Strain: Not on file  Food Insecurity: Not on file  Transportation Needs: Not on file  Physical Activity: Not on file  Stress: Not on file  Social Connections: Not on file  Intimate Partner Violence: Not on file   Family Status  Relation Name Status   Mother  Alive   Father  Alive   Sister  Alive   MGF  Deceased   Brother half- brother Alive       high cholestero  No partnership data on file   Family History  Problem Relation Age of Onset   Heart disease Mother    Hypertension Mother    Heart attack Mother 71   Arthritis Mother        RA   Diabetes Mother        Type 2   Hyperlipidemia Mother    Anxiety disorder Father    Anxiety disorder Sister    Aneurysm Maternal Grandfather        brain   Diabetes Brother    High Cholesterol Brother  No Known Allergies  Patient Care Team: Malva Limes, MD as PCP - General (Family Medicine) Sherrie Mustache Demetrios Isaacs, MD as Referring Physician (Family Medicine)   Medications: Outpatient Medications Prior to Visit  Medication Sig   [DISCONTINUED] ALPRAZolam (XANAX) 0.25 MG tablet TAKE 1-2 TABLETS (0.25-0.5 MG TOTAL) BY MOUTH AT BEDTIME AS NEEDED.   [DISCONTINUED] montelukast (SINGULAIR) 10 MG tablet TAKE 1 TABLET BY MOUTH EVERYDAY AT BEDTIME   [DISCONTINUED] predniSONE (DELTASONE) 10 MG tablet Take 60mg  PO daily x1d, then 50mg  daily x1d, then 40mg  daily x1d, then 30mg  daily x1d, then 20mg  daily x1d, then 10mg  daily x1d, then stop   No facility-administered medications prior to visit.    Review of Systems  Constitutional:  Negative for appetite change, chills and fever.  Respiratory:  Negative for chest  tightness, shortness of breath and wheezing.   Cardiovascular:  Negative for chest pain and palpitations.  Gastrointestinal:  Negative for abdominal pain, nausea and vomiting.      Objective    BP 111/77 (BP Location: Right Arm, Patient Position: Sitting, Cuff Size: Normal)   Pulse (!) 58   Ht 5' 4.57" (1.64 m)   Wt 177 lb (80.3 kg)   SpO2 100%   BMI 29.85 kg/m    Physical Exam  General Appearance:    Well developed, well nourished male. Alert, cooperative, in no acute distress, appears stated age  Head:    Normocephalic, without obvious abnormality, atraumatic  Eyes:    PERRL, conjunctiva/corneas clear, EOM's intact, fundi    benign, both eyes       Ears:    Normal TM's and external ear canals, both ears  Nose:   Nares normal, septum midline, mucosa normal, no drainage   or sinus tenderness  Throat:   Lips, mucosa, and tongue normal; teeth and gums normal  Neck:   Supple, symmetrical, trachea midline, no adenopathy;       thyroid:  No enlargement/tenderness/nodules; no carotid   bruit or JVD  Back:     Symmetric, no curvature, ROM normal, no CVA tenderness  Lungs:     Clear to auscultation bilaterally, respirations unlabored  Chest wall:    No tenderness or deformity  Heart:    Bradycardic. Normal rhythm. No murmurs, rubs, or gallops.  S1 and S2 normal  Abdomen:     Soft, non-tender, bowel sounds active all four quadrants,    no masses, no organomegaly  Genitalia:    deferred  Rectal:    deferred  Extremities:   All extremities are intact. No cyanosis or edema  Pulses:   2+ and symmetric all extremities  Skin:   Skin color, texture, turgor normal, no rashes or lesions  Lymph nodes:   Cervical, supraclavicular, and axillary nodes normal  Neurologic:   CNII-XII intact. Normal strength, sensation and reflexes      throughout       Last depression screening scores    06/08/2023    8:50 AM 08/08/2022    9:45 AM 07/23/2022    1:56 PM  PHQ 2/9 Scores  PHQ - 2 Score 0 0  0  PHQ- 9 Score 3 2 0   Last fall risk screening    08/08/2022    9:45 AM  Fall Risk   Falls in the past year? 0  Number falls in past yr: 0  Injury with Fall? 0  Risk for fall due to : No Fall Risks  Follow up Falls evaluation completed   Last Audit-C alcohol  use screening    08/08/2022    9:45 AM  Alcohol Use Disorder Test (AUDIT)  1. How often do you have a drink containing alcohol? 0  2. How many drinks containing alcohol do you have on a typical day when you are drinking? 0  3. How often do you have six or more drinks on one occasion? 0  AUDIT-C Score 0   A score of 3 or more in women, and 4 or more in men indicates increased risk for alcohol abuse, EXCEPT if all of the points are from question 1   No results found for any visits on 06/08/23.  Assessment & Plan    Routine Health Maintenance and Physical Exam  Exercise Activities and Dietary recommendations  Goals   None     Immunization History  Administered Date(s) Administered   Td 01/29/2022   Tdap 09/06/2010    Health Maintenance  Topic Date Due   HIV Screening  Never done   Hepatitis C Screening  Never done   Colonoscopy  Never done   COVID-19 Vaccine (1 - 2023-24 season) Never done   INFLUENZA VACCINE  10/26/2023 (Originally 02/26/2023)   DTaP/Tdap/Td (3 - Td or Tdap) 01/30/2032   HPV VACCINES  Aged Out    Discussed health benefits of physical activity, and encouraged him to engage in regular exercise appropriate for his age and condition.     iFOBT collection kit given to patient today.    Prediabetes  -Order A1C, complete metabolic panel.   Hyperlipidemia Patient stopped cholesterol medication due to side effects. Reports adherence to a low-fat diet. -Order lipid panel to assess current cholesterol levels.  Insomnia Reports difficulty maintaining sleep. Currently taking Alprazolam 0.25mg  at bedtime, but running short before the end of the month. -Continue Alprazolam 0.25mg  at  bedtime. -Renew prescription for Alprazolam.            Mila Merry, MD  [facility] [phone] (phone) [phone] (fax)  [facility]

## 2023-06-09 LAB — HEMOGLOBIN A1C
Est. average glucose Bld gHb Est-mCnc: 123 mg/dL
Hgb A1c MFr Bld: 5.9 % — ABNORMAL HIGH (ref 4.8–5.6)

## 2023-06-09 LAB — COMPREHENSIVE METABOLIC PANEL
ALT: 44 [IU]/L (ref 0–44)
AST: 29 [IU]/L (ref 0–40)
Albumin: 4.3 g/dL (ref 4.1–5.1)
Alkaline Phosphatase: 111 [IU]/L (ref 44–121)
BUN/Creatinine Ratio: 14 (ref 9–20)
BUN: 13 mg/dL (ref 6–24)
Bilirubin Total: 0.4 mg/dL (ref 0.0–1.2)
CO2: 24 mmol/L (ref 20–29)
Calcium: 9.2 mg/dL (ref 8.7–10.2)
Chloride: 103 mmol/L (ref 96–106)
Creatinine, Ser: 0.96 mg/dL (ref 0.76–1.27)
Globulin, Total: 2.6 g/dL (ref 1.5–4.5)
Glucose: 99 mg/dL (ref 70–99)
Potassium: 5.1 mmol/L (ref 3.5–5.2)
Sodium: 139 mmol/L (ref 134–144)
Total Protein: 6.9 g/dL (ref 6.0–8.5)
eGFR: 98 mL/min/{1.73_m2} (ref >59)

## 2023-06-09 LAB — LIPID PANEL
Chol/HDL Ratio: 5.3 ratio — ABNORMAL HIGH (ref 0.0–5.0)
Cholesterol, Total: 243 mg/dL — ABNORMAL HIGH (ref 100–199)
HDL: 46 mg/dL (ref >39)
LDL Chol Calc (NIH): 157 mg/dL — ABNORMAL HIGH (ref 0–99)
Triglycerides: 218 mg/dL — ABNORMAL HIGH (ref 0–149)
VLDL Cholesterol Cal: 40 mg/dL (ref 5–40)

## 2023-06-24 ENCOUNTER — Other Ambulatory Visit: Payer: Self-pay

## 2023-06-24 DIAGNOSIS — Z1211 Encounter for screening for malignant neoplasm of colon: Secondary | ICD-10-CM

## 2023-06-24 LAB — IFOBT (OCCULT BLOOD): IFOBT: NEGATIVE

## 2023-12-08 ENCOUNTER — Other Ambulatory Visit: Payer: Self-pay | Admitting: Family Medicine

## 2023-12-08 DIAGNOSIS — F419 Anxiety disorder, unspecified: Secondary | ICD-10-CM

## 2023-12-10 NOTE — Telephone Encounter (Signed)
 Requested medication (s) are due for refill today: yes  Requested medication (s) are on the active medication list: yes  Last refill:  06/08/23  Future visit scheduled: no  Notes to clinic:  Unable to refill per protocol, cannot delegate.      Requested Prescriptions  Pending Prescriptions Disp Refills   ALPRAZolam  (XANAX ) 0.25 MG tablet [Pharmacy Med Name: ALPRAZOLAM  0.25 MG TABLET] 30 tablet     Sig: Take 1-2 tablets (0.25-0.5 mg total) by mouth at bedtime as needed.     Not Delegated - Psychiatry: Anxiolytics/Hypnotics 2 Failed - 12/10/2023  1:07 PM      Failed - This refill cannot be delegated      Failed - Urine Drug Screen completed in last 360 days      Failed - Valid encounter within last 6 months    Recent Outpatient Visits   None            Passed - Patient is not pregnant

## 2024-03-01 ENCOUNTER — Other Ambulatory Visit: Payer: Self-pay | Admitting: Family Medicine

## 2024-03-01 DIAGNOSIS — F419 Anxiety disorder, unspecified: Secondary | ICD-10-CM

## 2024-03-03 NOTE — Telephone Encounter (Signed)
 Patient's wife is calling to follow up on the request. Reporting that patient has been out of medication since Tuesday. And patient has has not slept since Tuesday. Previous message was sent to Dr. Donzella for review. Advised patient's wife that medication refills can take up to three business days. Patients wife states that she will follow up tomorrow at 11:00 am.

## 2024-05-19 ENCOUNTER — Other Ambulatory Visit: Payer: Self-pay | Admitting: Family Medicine

## 2024-05-19 DIAGNOSIS — F419 Anxiety disorder, unspecified: Secondary | ICD-10-CM

## 2024-07-30 ENCOUNTER — Other Ambulatory Visit: Payer: Self-pay | Admitting: Family Medicine

## 2024-07-30 DIAGNOSIS — F419 Anxiety disorder, unspecified: Secondary | ICD-10-CM
